# Patient Record
Sex: Female | Born: 1960
Health system: Southern US, Community
[De-identification: ages and names within clinical notes are randomized; demographics above are authoritative.]

## PROBLEM LIST (undated history)

## (undated) DIAGNOSIS — Z800 Family history of malignant neoplasm of digestive organs: Secondary | ICD-10-CM

## (undated) DIAGNOSIS — Z803 Family history of malignant neoplasm of breast: Secondary | ICD-10-CM

## (undated) DIAGNOSIS — M67972 Unspecified disorder of synovium and tendon, left ankle and foot: Secondary | ICD-10-CM

## (undated) DIAGNOSIS — Z1379 Encounter for other screening for genetic and chromosomal anomalies: Secondary | ICD-10-CM

## (undated) DIAGNOSIS — D649 Anemia, unspecified: Secondary | ICD-10-CM

## (undated) DIAGNOSIS — R519 Headache, unspecified: Secondary | ICD-10-CM

## (undated) DIAGNOSIS — R51 Headache: Secondary | ICD-10-CM

## (undated) DIAGNOSIS — F419 Anxiety disorder, unspecified: Secondary | ICD-10-CM

## (undated) HISTORY — DX: Family history of malignant neoplasm of digestive organs: Z80.0

## (undated) HISTORY — PX: EYE SURGERY: SHX253

## (undated) HISTORY — PX: UTERINE FIBROID SURGERY: SHX826

## (undated) HISTORY — PX: TONSILLECTOMY: SUR1361

## (undated) HISTORY — PX: BREAST CYST EXCISION: SHX579

## (undated) HISTORY — DX: Unspecified disorder of synovium and tendon, left ankle and foot: M67.972

## (undated) HISTORY — DX: Encounter for other screening for genetic and chromosomal anomalies: Z13.79

## (undated) HISTORY — DX: Family history of malignant neoplasm of breast: Z80.3

---
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
## 1995-10-20 HISTORY — PX: GANGLION CYST EXCISION: SHX1691

## 2007-10-20 DIAGNOSIS — I499 Cardiac arrhythmia, unspecified: Secondary | ICD-10-CM

## 2007-10-20 HISTORY — DX: Cardiac arrhythmia, unspecified: I49.9

## 2013-02-17 ENCOUNTER — Other Ambulatory Visit: Payer: Self-pay | Admitting: Family Medicine

## 2013-02-17 ENCOUNTER — Ambulatory Visit
Admission: RE | Admit: 2013-02-17 | Discharge: 2013-02-17 | Disposition: A | Discharge status: Home / Self Care | Payer: 59 | Source: Ambulatory Visit | Attending: Family Medicine | Admitting: Family Medicine

## 2013-02-17 DIAGNOSIS — J984 Other disorders of lung: Secondary | ICD-10-CM

## 2013-03-09 ENCOUNTER — Other Ambulatory Visit: Payer: Self-pay

## 2013-03-09 DIAGNOSIS — Z1231 Encounter for screening mammogram for malignant neoplasm of breast: Secondary | ICD-10-CM

## 2013-03-17 ENCOUNTER — Ambulatory Visit
Admission: RE | Admit: 2013-03-17 | Discharge: 2013-03-17 | Disposition: A | Discharge status: Home / Self Care | Payer: 59 | Source: Ambulatory Visit

## 2013-03-17 DIAGNOSIS — Z1231 Encounter for screening mammogram for malignant neoplasm of breast: Secondary | ICD-10-CM

## 2013-03-23 ENCOUNTER — Other Ambulatory Visit: Payer: Self-pay | Admitting: Obstetrics and Gynecology

## 2013-03-23 DIAGNOSIS — R928 Other abnormal and inconclusive findings on diagnostic imaging of breast: Secondary | ICD-10-CM

## 2013-04-05 ENCOUNTER — Other Ambulatory Visit: Payer: 59

## 2013-04-07 ENCOUNTER — Ambulatory Visit
Admission: RE | Admit: 2013-04-07 | Discharge: 2013-04-07 | Disposition: A | Discharge status: Home / Self Care | Payer: 59 | Source: Ambulatory Visit | Attending: Obstetrics and Gynecology | Admitting: Obstetrics and Gynecology

## 2013-04-07 DIAGNOSIS — R928 Other abnormal and inconclusive findings on diagnostic imaging of breast: Secondary | ICD-10-CM

## 2013-09-11 ENCOUNTER — Other Ambulatory Visit: Payer: Self-pay | Admitting: Obstetrics and Gynecology

## 2013-09-11 DIAGNOSIS — N6082 Other benign mammary dysplasias of left breast: Secondary | ICD-10-CM

## 2013-10-05 ENCOUNTER — Ambulatory Visit
Admission: RE | Admit: 2013-10-05 | Discharge: 2013-10-05 | Disposition: A | Discharge status: Home / Self Care | Payer: Self-pay | Source: Ambulatory Visit | Attending: Obstetrics and Gynecology | Admitting: Obstetrics and Gynecology

## 2013-10-05 DIAGNOSIS — N6082 Other benign mammary dysplasias of left breast: Secondary | ICD-10-CM

## 2014-03-06 ENCOUNTER — Other Ambulatory Visit: Payer: Self-pay

## 2014-03-06 DIAGNOSIS — Z1231 Encounter for screening mammogram for malignant neoplasm of breast: Secondary | ICD-10-CM

## 2014-04-10 ENCOUNTER — Encounter (INDEPENDENT_AMBULATORY_CARE_PROVIDER_SITE_OTHER): Payer: Self-pay

## 2014-04-10 ENCOUNTER — Ambulatory Visit
Admission: RE | Admit: 2014-04-10 | Discharge: 2014-04-10 | Disposition: A | Discharge status: Home / Self Care | Payer: 59 | Source: Ambulatory Visit

## 2014-04-10 DIAGNOSIS — Z1231 Encounter for screening mammogram for malignant neoplasm of breast: Secondary | ICD-10-CM

## 2014-04-23 IMAGING — MG MM DIGITAL DIAGNOSTIC BILAT
4 series · 4 of 4 positions shown · non-contrast
Comparison: 02/18/2013 and earlier

CLINICAL DATA: The patient returns after screening study for
evaluation of both breasts.

DIGITAL DIAGNOSTIC BILATERAL MAMMOGRAM WITH CAD AND BILATERAL
BREAST ULTRASOUND:

[L MLO]
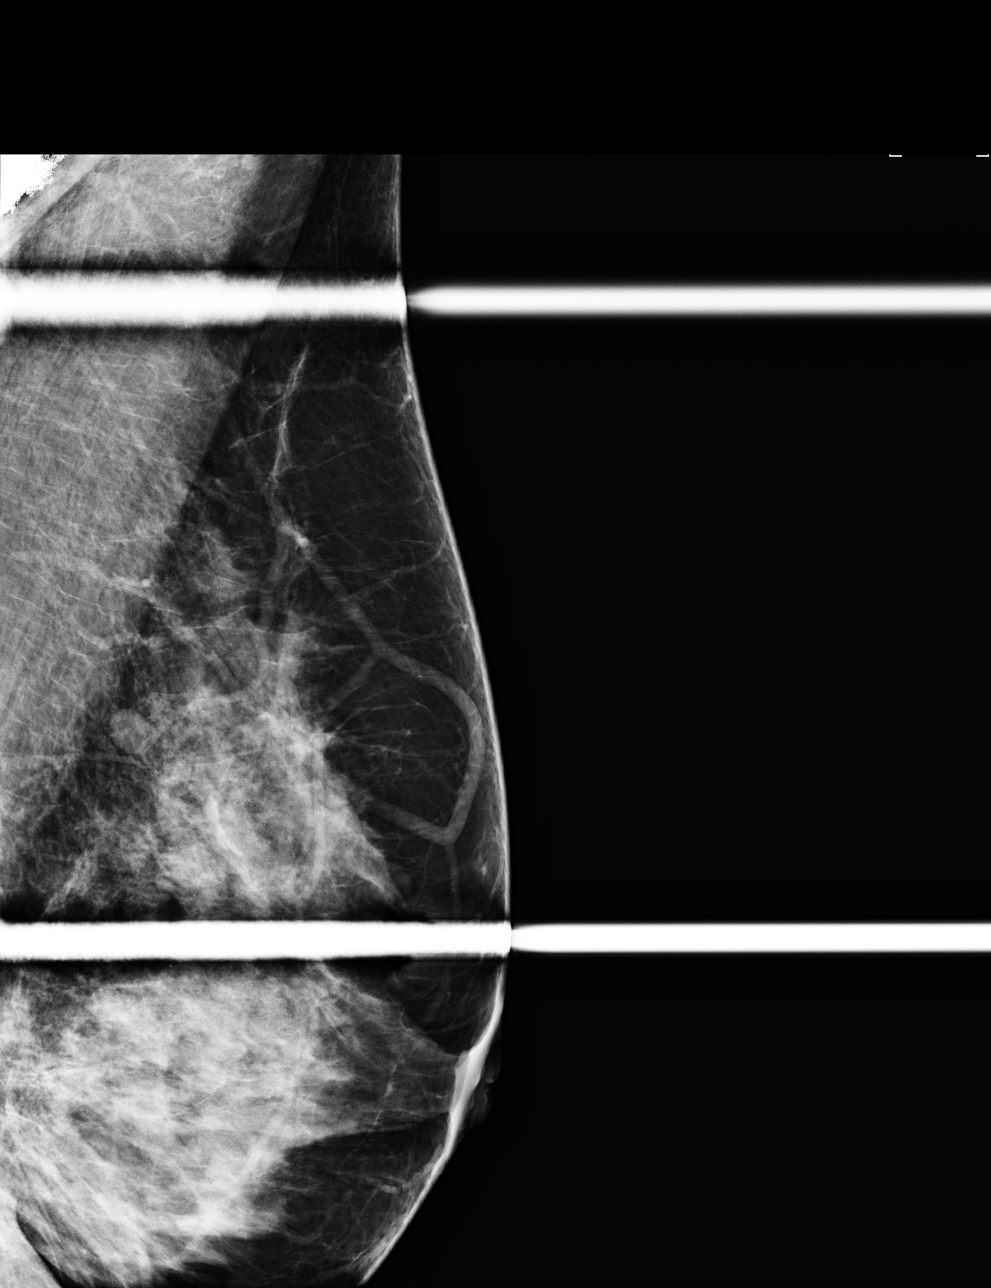

[R ML]
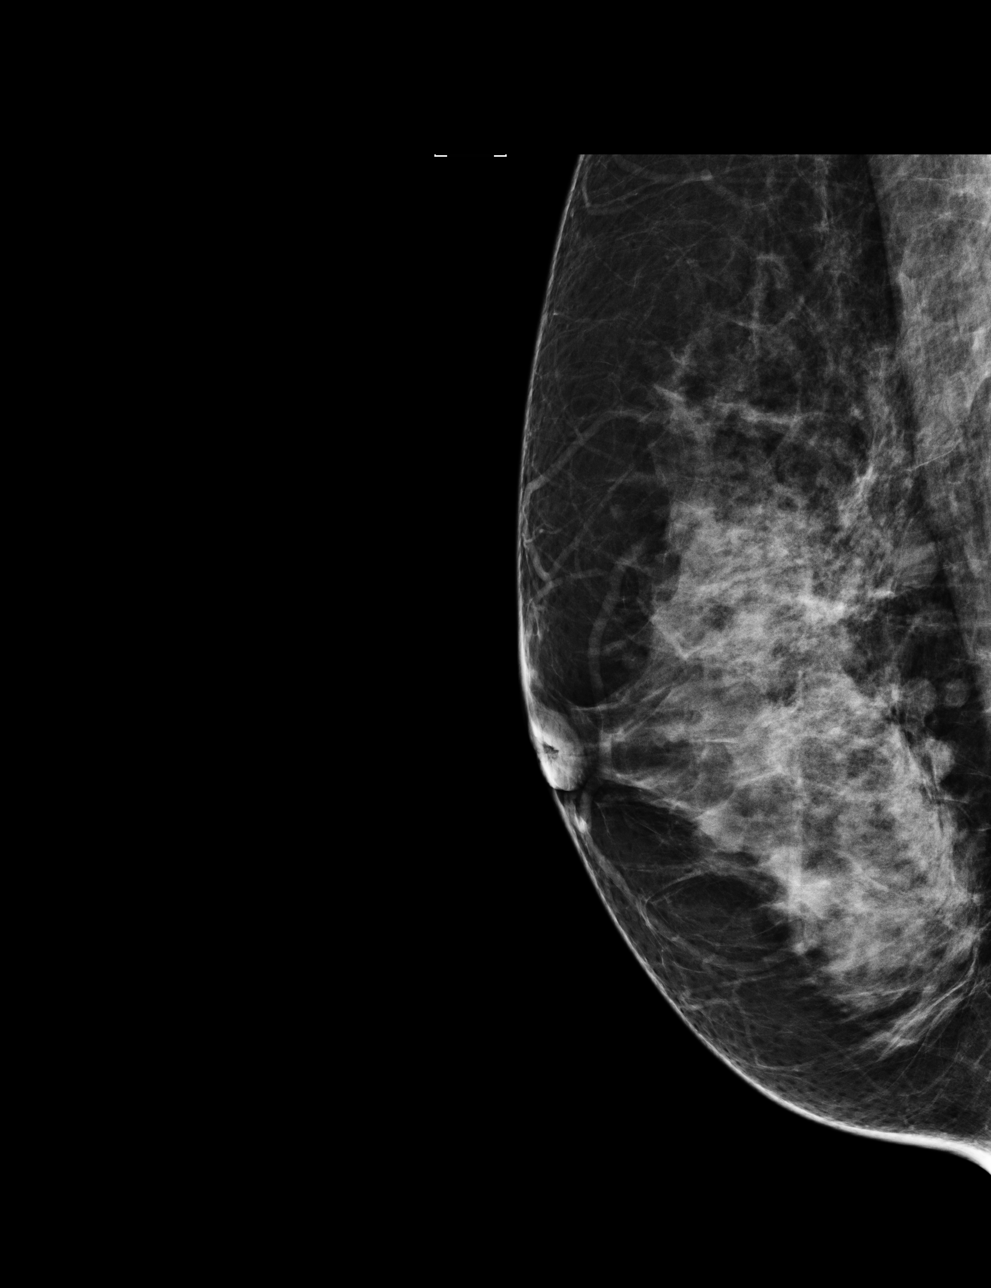

[R MLO]
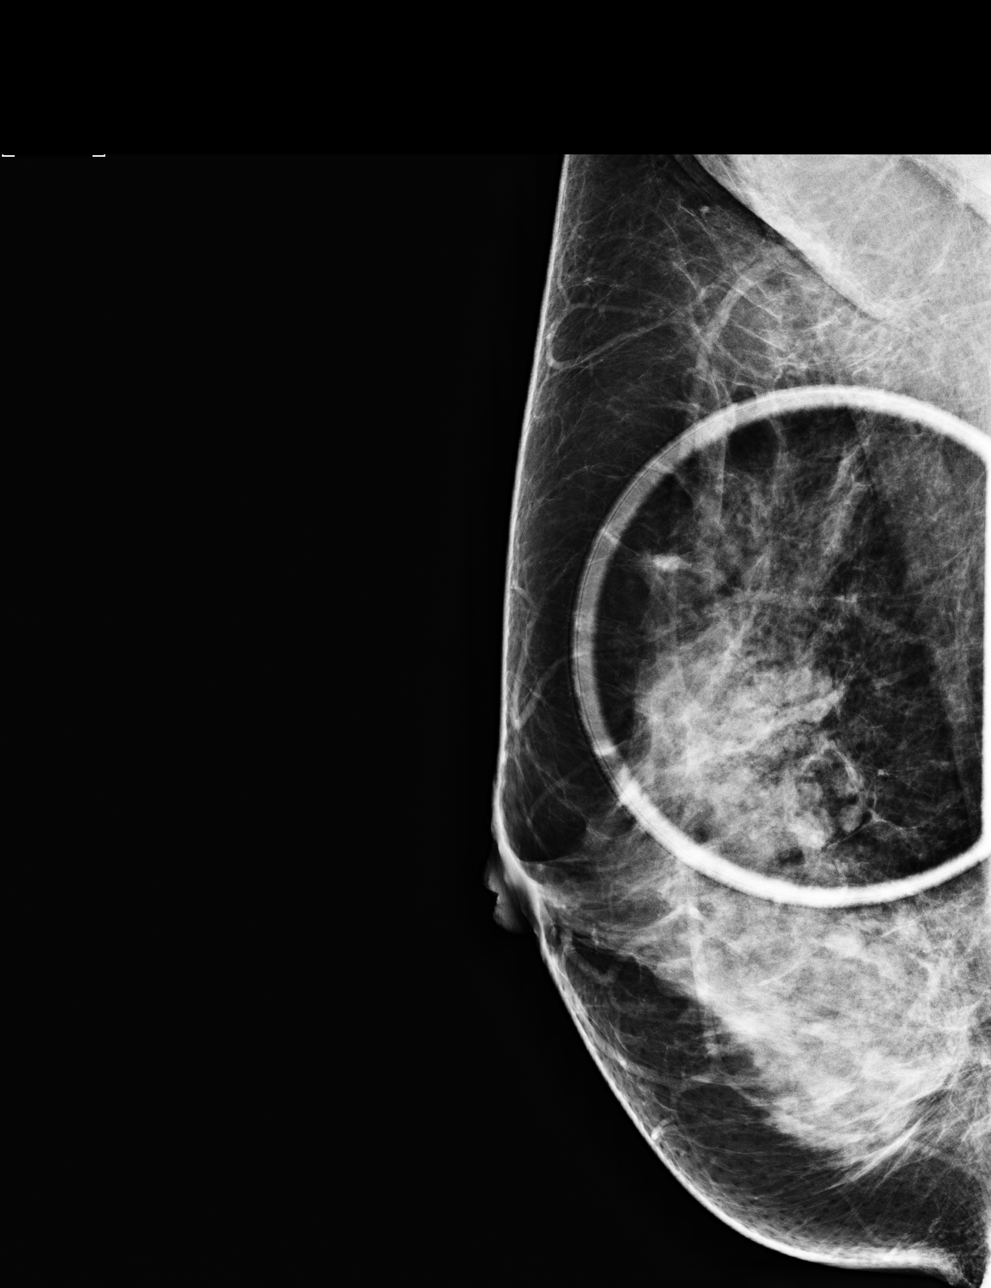

[L XCCL]
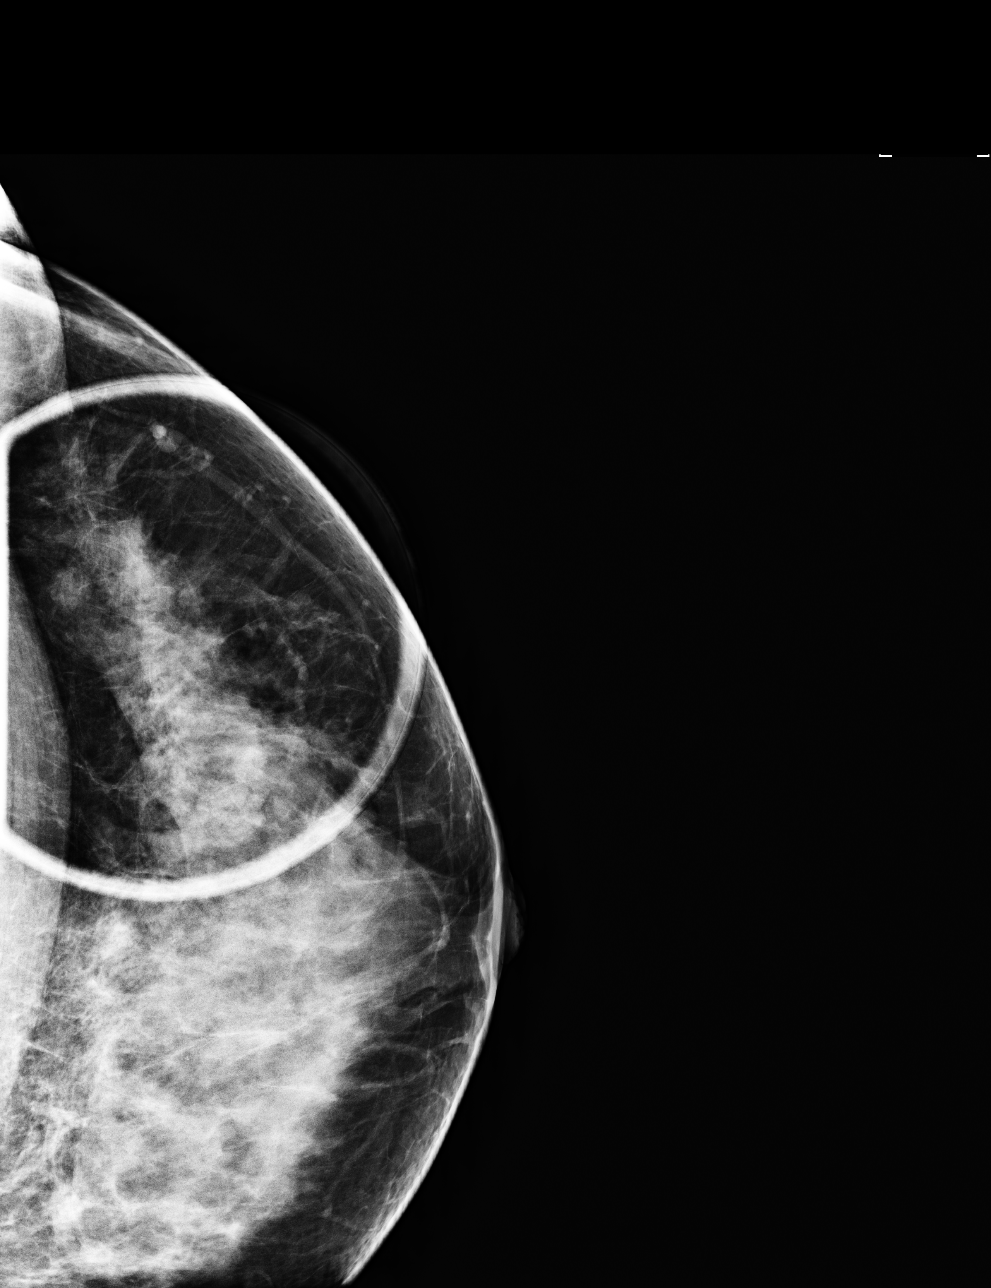

[4 of 4 positions shown; findings below may reference images not displayed]

FINDINGS: ACR Breast Density Category d: The breast tissue is extremely
dense.

Spot compression views are performed of the upper portion of the
right breast, showing no persistent abnormality.  Within the left
breast, there is minimal nodularity in the upper-outer quadrant,
further evaluated with ultrasound.

Mammographic images were processed with CAD.

On physical exam, I palpate no abnormality in the upper portion of
the right breast or in the upper-outer quadrant of the left breast.

Ultrasound is performed, showing a simple cyst in the 9 o'clock
location of the right breast, 7 cm from the nipple, measuring 1 cm
in diameter.  An adjacent small intramammary lymph node is 6 mm in
diameter.  Multiple small cysts are identified in the 8 o'clock
location.

In the lateral portion of the left breast, there are multiple small
simple cysts.  Additionally, there is an area of probable apocrine
metaplasia in the 4 o'clock location 3 cm from nipple.  This
measures 1.1 x 0.6 x 1.2 cm.  No solid mass or area of acoustic
shadowing identified.
IMPRESSION: 1.  Bilateral simple cysts.
2.  Area of probable apocrine metaplasia in the 4 o'clock location
of the left breast for which follow-up is recommended.

RECOMMENDATION:
Follow-up left ultrasound is suggested in 6 months to assess
stability.

I have discussed the findings and recommendations with the patient.
Results were also provided in writing at the conclusion of the
visit.  If applicable, a reminder letter will be sent to the
patient regarding the next appointment.

BI-RADS CATEGORY 3:  Probably benign finding(s) - short interval
follow-up suggested.

## 2015-05-29 ENCOUNTER — Other Ambulatory Visit: Payer: Self-pay

## 2015-05-29 DIAGNOSIS — Z1231 Encounter for screening mammogram for malignant neoplasm of breast: Secondary | ICD-10-CM

## 2015-06-07 ENCOUNTER — Ambulatory Visit: Payer: Self-pay

## 2015-06-12 ENCOUNTER — Ambulatory Visit
Admission: RE | Admit: 2015-06-12 | Discharge: 2015-06-12 | Disposition: A | Discharge status: Home / Self Care | Payer: BLUE CROSS/BLUE SHIELD | Source: Ambulatory Visit

## 2015-06-12 DIAGNOSIS — Z1231 Encounter for screening mammogram for malignant neoplasm of breast: Secondary | ICD-10-CM

## 2016-08-14 ENCOUNTER — Other Ambulatory Visit: Payer: Self-pay | Admitting: Family Medicine

## 2016-08-14 DIAGNOSIS — Z1231 Encounter for screening mammogram for malignant neoplasm of breast: Secondary | ICD-10-CM

## 2016-09-07 ENCOUNTER — Ambulatory Visit
Admission: RE | Admit: 2016-09-07 | Discharge: 2016-09-07 | Disposition: A | Discharge status: Home / Self Care | Payer: BLUE CROSS/BLUE SHIELD | Source: Ambulatory Visit | Attending: Family Medicine | Admitting: Family Medicine

## 2016-09-07 DIAGNOSIS — Z1231 Encounter for screening mammogram for malignant neoplasm of breast: Secondary | ICD-10-CM

## 2016-10-15 ENCOUNTER — Telehealth: Payer: Self-pay

## 2016-10-15 NOTE — Telephone Encounter (Signed)
PATIENT RECEIVED LETTER TO SCHEDULE TCS [phone]

## 2016-10-21 NOTE — Telephone Encounter (Signed)
LMOM to call.

## 2016-10-26 ENCOUNTER — Other Ambulatory Visit: Payer: Self-pay

## 2016-10-26 ENCOUNTER — Telehealth: Payer: Self-pay

## 2016-10-26 DIAGNOSIS — Z800 Family history of malignant neoplasm of digestive organs: Secondary | ICD-10-CM

## 2016-10-26 NOTE — Telephone Encounter (Signed)
Gastroenterology Pre-Procedure Review  Request Date: 10/26/2016 Requesting Physician: Dr. Sherwood GamblerFusco  PATIENT REVIEW QUESTIONS: The patient responded to the following health history questions as indicated:    1. Diabetes Melitis: no 2. Joint replacements in the past 12 months: no 3. Major health problems in the past 3 months: no 4. Has an artificial valve or MVP: no 5. Has a defibrillator: no 6. Has been advised in past to take antibiotics in advance of a procedure like teeth cleaning: no 7. Family history of colon cancer: YES    Her mom and not sure of her age 89. Alcohol Use: Rarely 9. History of sleep apnea: no  10. History of coronary artery or other vascular stents placed within the last 12 months: no    MEDICATIONS & ALLERGIES:    Patient reports the following regarding taking any blood thinners:   Plavix? no Aspirin? no Coumadin? no Brilinta? no Xarelto? no Eliquis? no Pradaxa? no Savaysa? no Effient? no  Patient confirms/reports the following medications:  Current Outpatient Prescriptions  Medication Sig Dispense Refill  . diphenhydrAMINE (BENADRYL) 25 MG tablet Take 25 mg by mouth every 6 (six) hours as needed. Rotates with Tylenol PM at bedtime    . diphenhydramine-acetaminophen (TYLENOL PM) 25-500 MG TABS tablet Take 1 tablet by mouth at bedtime as needed. Rotates with Benadryl at bedtime     No current facility-administered medications for this visit.     Patient confirms/reports the following allergies:  Allergies  Allergen Reactions  . Erythromycin Hives and Nausea And Vomiting    No orders of the defined types were placed in this encounter.   AUTHORIZATION INFORMATION Primary Insurance:  ID #:   Group #:  Pre-Cert / Auth required:  Pre-Cert / Auth #:   Secondary Insurance:   ID #:  Group #:  Pre-Cert / Auth required:  Pre-Cert / Auth #:   SCHEDULE INFORMATION: Procedure has been scheduled as follows:  Date: 12/07/2016              Time: 8:30 AM   Location: [facility] Stay  This Gastroenterology Pre-Precedure Review Form is being routed to the following provider(s): Jonette EvaSandi Fields, MD

## 2016-10-27 MED ORDER — NA SULFATE-K SULFATE-MG SULF 17.5-3.13-1.6 GM/177ML PO SOLN: 1.0000 | ORAL | 0 refills | Status: DC

## 2016-10-27 NOTE — Telephone Encounter (Signed)
See separate triage.  

## 2016-10-27 NOTE — Telephone Encounter (Signed)

## 2016-10-27 NOTE — Telephone Encounter (Signed)
Rx sent to the pharmacy and instructions mailed to pt.  

## 2016-11-19 MED ORDER — PEG 3350-KCL-NA BICARB-NACL 420 G PO SOLR: 4000.0000 mL | ORAL | 0 refills | Status: DC

## 2016-11-19 NOTE — Addendum Note (Signed)
Addended by: Lavena BullionSTEWART, Dezerae Freiberger H on: 11/19/2016 08:56 AM   Modules accepted: Orders

## 2016-11-19 NOTE — Telephone Encounter (Signed)
Suprep not covered, sending in Loyalr[employer] and mailing new instructions and LMOM for pt to disregard the previous instructions.

## 2016-12-03 ENCOUNTER — Telehealth: Payer: Self-pay

## 2016-12-03 NOTE — Telephone Encounter (Signed)
I called [employer] @ [phone] and spoke to Montenegroherita S who said a PA is not required for the colonoscopy as out patient.

## 2016-12-03 NOTE — Telephone Encounter (Signed)
REVIEWED-NO ADDITIONAL RECOMMENDATIONS. 

## 2016-12-03 NOTE — Telephone Encounter (Signed)
I called pt to update triage and she has had no change in her meds since she was triaged.

## 2016-12-07 ENCOUNTER — Encounter (HOSPITAL_COMMUNITY)
Admission: RE | Disposition: A | Discharge status: Home Health | Payer: Self-pay | Source: Ambulatory Visit | Attending: Gastroenterology

## 2016-12-07 ENCOUNTER — Ambulatory Visit (HOSPITAL_COMMUNITY)
Admission: RE | Admit: 2016-12-07 | Discharge: 2016-12-07 | Disposition: A | Discharge status: Home Health | Payer: BLUE CROSS/BLUE SHIELD | Source: Ambulatory Visit | Attending: Gastroenterology | Admitting: Gastroenterology

## 2016-12-07 ENCOUNTER — Encounter (HOSPITAL_COMMUNITY): Payer: Self-pay

## 2016-12-07 DIAGNOSIS — Z800 Family history of malignant neoplasm of digestive organs: Secondary | ICD-10-CM

## 2016-12-07 DIAGNOSIS — Z1212 Encounter for screening for malignant neoplasm of rectum: Secondary | ICD-10-CM

## 2016-12-07 DIAGNOSIS — K644 Residual hemorrhoidal skin tags: Secondary | ICD-10-CM | POA: Insufficient documentation

## 2016-12-07 DIAGNOSIS — Z79899 Other long term (current) drug therapy: Secondary | ICD-10-CM | POA: Insufficient documentation

## 2016-12-07 DIAGNOSIS — F419 Anxiety disorder, unspecified: Secondary | ICD-10-CM | POA: Diagnosis not present

## 2016-12-07 DIAGNOSIS — K648 Other hemorrhoids: Secondary | ICD-10-CM | POA: Diagnosis not present

## 2016-12-07 DIAGNOSIS — K5730 Diverticulosis of large intestine without perforation or abscess without bleeding: Secondary | ICD-10-CM | POA: Diagnosis not present

## 2016-12-07 DIAGNOSIS — Z1211 Encounter for screening for malignant neoplasm of colon: Secondary | ICD-10-CM

## 2016-12-07 HISTORY — DX: Headache, unspecified: R51.9

## 2016-12-07 HISTORY — DX: Headache: R51

## 2016-12-07 HISTORY — DX: Anxiety disorder, unspecified: F41.9

## 2016-12-07 HISTORY — PX: COLONOSCOPY: SHX5424

## 2016-12-07 SURGERY — COLONOSCOPY
Anesthesia: Moderate Sedation

## 2016-12-07 MED ORDER — MEPERIDINE HCL 100 MG/ML IJ SOLN
INTRAMUSCULAR | Status: AC
  Filled 2016-12-07: qty 2

## 2016-12-07 MED ORDER — MEPERIDINE HCL 100 MG/ML IJ SOLN
INTRAMUSCULAR | Status: DC | PRN
  Administered 2016-12-07 (×2): 25 mg via INTRAVENOUS

## 2016-12-07 MED ORDER — MIDAZOLAM HCL 5 MG/5ML IJ SOLN
INTRAMUSCULAR | Status: DC | PRN
  Administered 2016-12-07 (×2): 2 mg via INTRAVENOUS

## 2016-12-07 MED ORDER — STERILE WATER FOR IRRIGATION IR SOLN
Status: DC | PRN
  Administered 2016-12-07: 09:00:00

## 2016-12-07 MED ORDER — SODIUM CHLORIDE 0.9 % IV SOLN
INTRAVENOUS | Status: DC
  Administered 2016-12-07: 09:00:00 via INTRAVENOUS

## 2016-12-07 MED ORDER — MIDAZOLAM HCL 5 MG/5ML IJ SOLN
INTRAMUSCULAR | Status: AC
  Filled 2016-12-07: qty 10

## 2016-12-07 NOTE — Discharge Instructions (Signed)
You DID NOT HAVE ANY POLYPS. YOU HAVE DIVERTICULOSIS IN YOUR LEFT COLON. You have MODERATE internal AND EXTERNAL hemorrhoids.   DRINK WATER TO KEEP URINE LIGHT YELLOW.  FOLLOW A HIGH FIBER DIET. AVOID ITEMS THAT CAUSE BLOATING & GAS. SEE INFO BELOW.  Next colonoscopy in 10 years BECAUSE YOUR MOTHER HAD COLON CANCER AFTER THE AGE OF 60 AND YOU HAVE HAD TWO COLONOSCOPIES AND HAVE NOT HAD POLYPS.   Colonoscopy Care After Read the instructions outlined below and refer to this sheet in the next week. These discharge instructions provide you with general information on caring for yourself after you leave the hospital. While your treatment has been planned according to the most current medical practices available, unavoidable complications occasionally occur. If you have any problems or questions after discharge, call DR. Wanda Rideout, [phone].  ACTIVITY  You may resume your regular activity, but move at a slower pace for the next 24 hours.   Take frequent rest periods for the next 24 hours.   Walking will help get rid of the air and reduce the bloated feeling in your belly (abdomen).   No driving for 24 hours (because of the medicine (anesthesia) used during the test).   You may shower.   Do not sign any important legal documents or operate any machinery for 24 hours (because of the anesthesia used during the test).    NUTRITION  Drink plenty of fluids.   You may resume your normal diet as instructed by your doctor.   Begin with a light meal and progress to your normal diet. Heavy or fried foods are harder to digest and may make you feel sick to your stomach (nauseated).   Avoid alcoholic beverages for 24 hours or as instructed.    MEDICATIONS  You may resume your normal medications.   WHAT YOU CAN EXPECT TODAY  Some feelings of bloating in the abdomen.   Passage of more gas than usual.   Spotting of blood in your stool or on the toilet paper  .  IF YOU HAD POLYPS  REMOVED DURING THE COLONOSCOPY:  Eat a soft diet IF YOU HAVE NAUSEA, BLOATING, ABDOMINAL PAIN, OR VOMITING.    FINDING OUT THE RESULTS OF YOUR TEST Not all test results are available during your visit. DR. Darrick Penna WILL CALL YOU WITHIN 14 DAYS OF YOUR PROCEDUE WITH YOUR RESULTS. Do not assume everything is normal if you have not heard from DR. Princeston Blizzard, CALL HER OFFICE AT [phone].  SEEK IMMEDIATE MEDICAL ATTENTION AND CALL THE OFFICE: [phone] IF:  You have more than a spotting of blood in your stool.   Your belly is swollen (abdominal distention).   You are nauseated or vomiting.   You have a temperature over 101F.   You have abdominal pain or discomfort that is severe or gets worse throughout the day.   Constipation in Adults Constipation is having fewer than 2 bowel movements per week. Usually, the stools are hard. As we grow older, constipation is more common. If you try to fix constipation with laxatives, the problem may get worse. This is because laxatives taken over a long period of time make the colon muscles weaker. A low-fiber diet, not taking in enough fluids, and taking some medicines may make these problems worse.  HOME CARE INSTRUCTIONS  Constipation is usually best cared for without medicines. Increasing dietary fiber and eating more fruits and vegetables is the best way to manage constipation.   Slowly increase fiber intake to 25 to 38  grams per day. Whole grains, fruits, vegetables, and legumes are good sources of fiber. A dietitian can further help you incorporate high-fiber foods into your diet.   Drink enough water and fluids to keep your urine clear or pale yellow.   A fiber supplement may be added to your diet if you cannot get enough fiber from foods.   Increasing your activities also helps improve regularity.   Stronger measures, such as magnesium sulfate, should be avoided if possible. This may cause uncontrollable diarrhea. Using magnesium sulfate  may not allow you time to make it to the bathroom.    High-Fiber Diet A high-fiber diet changes your normal diet to include more whole grains, legumes, fruits, and vegetables. Changes in the diet involve replacing refined carbohydrates with unrefined foods. The calorie level of the diet is essentially unchanged. The Dietary Reference Intake (recommended amount) for adult males is 38 grams per day. For adult females, it is 25 grams per day. Pregnant and lactating women should consume 28 grams of fiber per day. Fiber is the intact part of a plant that is not broken down during digestion. Functional fiber is fiber that has been isolated from the plant to provide a beneficial effect in the body. PURPOSE  Increase stool bulk.   Ease and regulate bowel movements.   Lower cholesterol.   REDUCE RISK OF COLON CANCER  INDICATIONS THAT YOU NEED MORE FIBER  Constipation and hemorrhoids.   Uncomplicated diverticulosis (intestine condition) and irritable bowel syndrome.   Weight management.   As a protective measure against hardening of the arteries (atherosclerosis), diabetes, and cancer.   GUIDELINES FOR INCREASING FIBER IN THE DIET  Start adding fiber to the diet slowly. A gradual increase of about 5 more grams (2 slices of whole-wheat bread, 2 servings of most fruits or vegetables, or 1 bowl of high-fiber cereal) per day is best. Too rapid an increase in fiber may result in constipation, flatulence, and bloating.   Drink enough water and fluids to keep your urine clear or pale yellow. Water, juice, or caffeine-free drinks are recommended. Not drinking enough fluid may cause constipation.   Eat a variety of high-fiber foods rather than one type of fiber.   Try to increase your intake of fiber through using high-fiber foods rather than fiber pills or supplements that contain small amounts of fiber.   The goal is to change the types of food eaten. Do not supplement your present diet with  high-fiber foods, but replace foods in your present diet.   INCLUDE A VARIETY OF FIBER SOURCES  Replace refined and processed grains with whole grains, canned fruits with fresh fruits, and incorporate other fiber sources. White rice, white breads, and most bakery goods contain little or no fiber.   Brown whole-grain rice, buckwheat oats, and many fruits and vegetables are all good sources of fiber. These include: broccoli, Brussels sprouts, cabbage, cauliflower, beets, sweet potatoes, white potatoes (skin on), carrots, tomatoes, eggplant, squash, berries, fresh fruits, and dried fruits.   Cereals appear to be the richest source of fiber. Cereal fiber is found in whole grains and bran. Bran is the fiber-rich outer coat of cereal grain, which is largely removed in refining. In whole-grain cereals, the bran remains. In breakfast cereals, the largest amount of fiber is found in those with "bran" in their names. The fiber content is sometimes indicated on the label.   You may need to include additional fruits and vegetables each day.   In baking, for 1  cup white flour, you may use the following substitutions:   1 cup whole-wheat flour minus 2 tablespoons.   1/2 cup white flour plus 1/2 cup whole-wheat flour.   Diverticulosis Diverticulosis is a common condition that develops when small pouches (diverticula) form in the wall of the colon. The risk of diverticulosis increases with age. It happens more often in people who eat a low-fiber diet. Most individuals with diverticulosis have no symptoms. Those individuals with symptoms usually experience belly (abdominal) pain, constipation, or loose stools (diarrhea).  HOME CARE INSTRUCTIONS  Increase the amount of fiber in your diet as directed by your caregiver or dietician. This may reduce symptoms of diverticulosis.   Drink at least 6 to 8 glasses of water each day to prevent constipation.   Try not to strain when you have a bowel movement.    THERE IS NO NEED TO Avoid nuts and seeds to prevent complications.   FOODS HAVING HIGH FIBER CONTENT INCLUDE:  Fruits. Apple, peach, pear, tangerine, raisins, prunes.   Vegetables. Brussels sprouts, asparagus, broccoli, cabbage, carrot, cauliflower, romaine lettuce, spinach, summer squash, tomato, winter squash, zucchini.   Starchy Vegetables. Baked beans, kidney beans, lima beans, split peas, lentils, potatoes (with skin).   Grains. Whole wheat bread, brown rice, bran flake cereal, plain oatmeal, white rice, shredded wheat, bran muffins.    Hemorrhoids Hemorrhoids are dilated (enlarged) veins around the rectum. Sometimes clots will form in the veins. This makes them swollen and painful. These are called thrombosed hemorrhoids. Causes of hemorrhoids include:  Constipation.   Straining to have a bowel movement.   HEAVY LIFTING  HOME CARE INSTRUCTIONS  Eat a well balanced diet and drink 6 to 8 glasses of water every day to avoid constipation. You may also use a bulk laxative.   Avoid straining to have bowel movements.   Keep anal area dry and clean.   Do not use a donut shaped pillow or sit on the toilet for long periods. This increases blood pooling and pain.   Move your bowels when your body has the urge; this will require less straining and will decrease pain and pressure.

## 2016-12-07 NOTE — Op Note (Signed)
[facility] Patient Name: Carolyn Glover Procedure Date: 12/07/2016 7:13 AM MRN: [id number] Date of Birth: 1961-01-18 Attending MD: Jonette Eva , MD CSN: [id number] Age: 56 Admit Type: Outpatient Procedure:                Colonoscopy, SCREENING Indications:              Screening in patient at increased risk: Colorectal                            cancer in mother 59 or older. HAD COLONOSCOPY                            PREVIOUSLY-NO POLYPS Providers:                Jonette Eva, MD, Loma Messing B. Patsy Lager, RN, Dyann Ruddle Referring MD:             Avis Epley PA Medicines:                Meperidine 50 mg IV, Midazolam 4 mg IV Complications:            No immediate complications. Estimated Blood Loss:     Estimated blood loss: none. Procedure:                Pre-Anesthesia Assessment:                           - Prior to the procedure, a History and Physical                            was performed, and patient medications and                            allergies were reviewed. The patient's tolerance of                            previous anesthesia was also reviewed. The risks                            and benefits of the procedure and the sedation                            options and risks were discussed with the patient.                            All questions were answered, and informed consent                            was obtained. Prior Anticoagulants: The patient has                            taken no previous anticoagulant or antiplatelet  agents. ASA Grade Assessment: II - A patient with                            mild systemic disease. After reviewing the risks                            and benefits, the patient was deemed in                            satisfactory condition to undergo the procedure.                            After obtaining informed consent, the colonoscope   was passed under direct vision. Throughout the                            procedure, the patient's blood pressure, pulse, and                            oxygen saturations were monitored continuously. The                            EC-[id number] ([id number]) scope was introduced through                            the anus and advanced to the 3 cm into the ileum.                            The colonoscopy was somewhat difficult due to a                            tortuous colon. Successful completion of the                            procedure was aided by COLOWRAP. The patient                            tolerated the procedure well. The quality of the                            bowel preparation was excellent. The terminal                            ileum, ileocecal valve, appendiceal orifice, and                            rectum were photographed. Scope In: 8:56:11 AM Scope Out: 9:09:50 AM Total Procedure Duration: 0 hours 13 minutes 39 seconds  Findings:      The digital rectal exam findings include non-thrombosed external       hemorrhoids.      The terminal ileum appeared normal.      Multiple small and large-mouthed diverticula were found in the       recto-sigmoid colon and sigmoid colon.  External and internal hemorrhoids were found during retroflexion. The       hemorrhoids were moderate. Impression:               - Non-thrombosed external hemorrhoids found on                            digital rectal exam.                           - The examined portion of the ileum was normal.                           - Diverticulosis in the recto-sigmoid colon and in                            the sigmoid colon.                           - External and internal hemorrhoids. Moderate Sedation:      Moderate (conscious) sedation was administered by the endoscopy nurse       and supervised by the endoscopist. The following parameters were       monitored: oxygen saturation, heart rate, blood  pressure, and response       to care. Total physician intraservice time was 22 minutes. Recommendation:           - High fiber diet.                           - Continue present medications.                           - Repeat colonoscopy in 10 years for surveillance                            BECAUSE MOTHER HAD COLON CANECR AFTER AGE 72.                           - Patient has a contact number available for                            emergencies. The signs and symptoms of potential                            delayed complications were discussed with the                            patient. Return to normal activities tomorrow.                            Written discharge instructions were provided to the                            patient. Procedure Code(s):        --- Professional ---  [phone], Colonoscopy, flexible; diagnostic, including                            collection of specimen(s) by brushing or washing,                            when performed (separate procedure)                           99152, Moderate sedation services provided by the                            same physician or other qualified health care                            professional performing the diagnostic or                            therapeutic service that the sedation supports,                            requiring the presence of an independent trained                            observer to assist in the monitoring of the                            patient's level of consciousness and physiological                            status; initial 15 minutes of intraservice time,                            patient age 69 years or older Diagnosis Code(s):        --- Professional ---                           Z80.0, Family history of malignant neoplasm of                            digestive organs                           K64.4, Residual hemorrhoidal skin tags                            K64.8, Other hemorrhoids                           K57.30, Diverticulosis of large intestine without                            perforation or abscess without bleeding CPT copyright 2016 [employer]. All rights reserved. The codes documented in this report are preliminary and upon coder review may  be revised to meet current compliance requirements.  Jonette Eva, MD Jonette Eva, MD 12/07/2016 9:27:38 AM This report has been signed electronically. Number of Addenda: 0

## 2016-12-07 NOTE — Progress Notes (Signed)
Patient's husband informed of discharge instructions. Verbally stated that he understands. No questions at this time.

## 2016-12-07 NOTE — H&P (Addendum)
Primary Care Physician:  Jana Half Primary Gastroenterologist:  Dr. Oneida Alar  Pre-Procedure History & Physical: HPI:  Carolyn Glover is a 56 y.o. female here for COLON CANCER SCREENING-FAMILY Hx COLON CA-Mother HAD COLON CA AGE > 60.  Past Medical History:  Diagnosis Date  . Anxiety   . Headache    Past Surgical History:  Procedure Laterality Date  . BUNIONECTOMY Bilateral 1980  . EYE SURGERY  2004, 2010   lasix,   . GANGLION CYST EXCISION Left 1997  . TONSILLECTOMY     56 years old  . UTERINE FIBROID SURGERY  mid 1990    Prior to Admission medications   Medication Sig Start Date End Date Taking? Authorizing Provider  ALPRAZolam Duanne Moron) 0.5 MG tablet Take 0.5 mg by mouth at bedtime as needed for sleep.  09/01/16  Yes Historical Provider, MD  cycloSPORINE (RESTASIS) 0.05 % ophthalmic emulsion Place 1 drop into both eyes 2 (two) times daily as needed (dry eyes).   Yes Historical Provider, MD  diphenhydrAMINE (BENADRYL) 25 MG tablet Take 25 mg by mouth every 6 (six) hours as needed. Rotates with Tylenol PM at bedtime   Yes Historical Provider, MD  diphenhydramine-acetaminophen (TYLENOL PM) 25-500 MG TABS tablet Take 1 tablet by mouth at bedtime as needed. Rotates with Benadryl at bedtime   Yes Historical Provider, MD  ibuprofen (ADVIL,MOTRIN) 200 MG tablet Take 600 mg by mouth every 6 (six) hours as needed for headache.   Yes Historical Provider, MD  Multiple Vitamin (MULTIVITAMIN WITH MINERALS) TABS tablet Take 1 tablet by mouth daily.   Yes Historical Provider, MD  Polyethyl Glycol-Propyl Glycol (SYSTANE OP) Apply 1 drop to eye daily as needed (dry eyes).   Yes Historical Provider, MD  polyethylene glycol-electrolytes (TRILYTE) 420 g solution Take 4,000 mLs by mouth as directed. 11/19/16  Yes Danie Binder, MD  Na Sulfate-K Sulfate-Mg Sulf (SUPREP BOWEL PREP KIT) 17.5-3.13-1.6 GM/180ML SOLN Take 1 kit by mouth as directed. Patient not taking: Reported on 12/01/2016 10/27/16    Danie Binder, MD    Allergies as of 10/26/2016 - never reviewed  Allergen Reaction Noted  . Erythromycin Hives and Nausea And Vomiting 10/26/2016    Family History  Problem Relation Age of Onset  . Colon cancer Mother     Social History   Social History  . Marital status: Divorced    Spouse name: N/A  . Number of children: N/A  . Years of education: N/A   Occupational History  . Not on file.   Social History Main Topics  . Smoking status: Never Smoker  . Smokeless tobacco: Never Used  . Alcohol use Yes  . Drug use: No  . Sexual activity: Not on file   Other Topics Concern  . Not on file   Social History Narrative  . No narrative on file    Review of Systems: See HPI, otherwise negative ROS   Physical Exam: BP 117/77   Pulse (!) 54   Temp 97.8 F (36.6 C) (Oral)   Resp 10   Ht _0  (1.702 m)   Wt 165 lb (74.8 kg)   SpO2 100%   BMI 25.84 kg/m  General:   Alert,  pleasant and cooperative in NAD Head:  Normocephalic and atraumatic. Neck:  Supple; Lungs:  Clear throughout to auscultation.    Heart:  Regular rate and rhythm. Abdomen:  Soft, nontender and nondistended. Normal bowel sounds, without guarding, and without rebound.   Neurologic:  Alert and  oriented x4;  grossly normal neurologically.  Impression/Plan:     SCREENING  Plan:  1. TCS TODAY. DISCUSSED PROCEDURE, BENEFITS, & RISKS: < 1% chance of medication reaction, bleeding, perforation, or rupture of spleen/liver.

## 2016-12-09 ENCOUNTER — Encounter (HOSPITAL_COMMUNITY): Payer: Self-pay | Admitting: Gastroenterology

## 2017-08-03 ENCOUNTER — Other Ambulatory Visit: Payer: Self-pay | Admitting: Family Medicine

## 2017-08-03 DIAGNOSIS — Z1231 Encounter for screening mammogram for malignant neoplasm of breast: Secondary | ICD-10-CM

## 2017-09-08 ENCOUNTER — Ambulatory Visit
Admission: RE | Admit: 2017-09-08 | Discharge: 2017-09-08 | Disposition: A | Discharge status: Home / Self Care | Payer: BLUE CROSS/BLUE SHIELD | Source: Ambulatory Visit | Attending: Family Medicine | Admitting: Family Medicine

## 2017-09-08 DIAGNOSIS — Z1231 Encounter for screening mammogram for malignant neoplasm of breast: Secondary | ICD-10-CM

## 2018-08-22 ENCOUNTER — Ambulatory Visit: Payer: Managed Care, Other (non HMO) | Admitting: Sports Medicine

## 2018-08-22 ENCOUNTER — Encounter: Payer: Self-pay | Admitting: Sports Medicine

## 2018-08-22 VITALS — BP 108/88 | Ht 66.5 in | Wt 165.0 lb

## 2018-08-22 DIAGNOSIS — R29898 Other symptoms and signs involving the musculoskeletal system: Secondary | ICD-10-CM | POA: Diagnosis not present

## 2018-08-22 DIAGNOSIS — M2140 Flat foot [pes planus] (acquired), unspecified foot: Secondary | ICD-10-CM | POA: Insufficient documentation

## 2018-08-22 DIAGNOSIS — M67972 Unspecified disorder of synovium and tendon, left ankle and foot: Secondary | ICD-10-CM | POA: Diagnosis not present

## 2018-08-22 DIAGNOSIS — M2142 Flat foot [pes planus] (acquired), left foot: Secondary | ICD-10-CM

## 2018-08-22 DIAGNOSIS — M2141 Flat foot [pes planus] (acquired), right foot: Secondary | ICD-10-CM

## 2018-08-22 NOTE — Assessment & Plan Note (Addendum)
Patient has significant pes planus, R greater than L, with h/o bilateral bunionectomies. On gait analysis she does have some pronation issues, again greater on R. Given green orthotics with scaphoid pad.

## 2018-08-22 NOTE — Addendum Note (Signed)
Addended by: Rutha Bouchard E on: 08/22/2018 11:31 AM   Modules accepted: Orders

## 2018-08-22 NOTE — Assessment & Plan Note (Signed)
Patient with some L achilles tightness without acute injury. Given home exercises.

## 2018-08-22 NOTE — Assessment & Plan Note (Signed)
Patient with weak L hip abductors causing some L knee pain along IT band. No acute injury. Given home exercises for strengthening and injury prevention.
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
## 2018-09-19 ENCOUNTER — Other Ambulatory Visit: Payer: Self-pay | Admitting: Family Medicine

## 2018-09-19 DIAGNOSIS — Z1231 Encounter for screening mammogram for malignant neoplasm of breast: Secondary | ICD-10-CM

## 2018-10-03 ENCOUNTER — Ambulatory Visit: Payer: Managed Care, Other (non HMO) | Admitting: Sports Medicine

## 2018-10-03 ENCOUNTER — Encounter: Payer: Self-pay | Admitting: Sports Medicine

## 2018-10-03 VITALS — BP 116/84 | Ht 67.0 in | Wt 172.0 lb

## 2018-10-03 DIAGNOSIS — R29898 Other symptoms and signs involving the musculoskeletal system: Secondary | ICD-10-CM | POA: Diagnosis not present

## 2018-10-03 DIAGNOSIS — M2141 Flat foot [pes planus] (acquired), right foot: Secondary | ICD-10-CM

## 2018-10-03 DIAGNOSIS — M2142 Flat foot [pes planus] (acquired), left foot: Secondary | ICD-10-CM

## 2018-10-03 NOTE — Progress Notes (Signed)
HPI Patient with history of bilateral pes planus and hip abductor weakness presents for follow-up.  She said that she had been doing well with her orthotics despite minimal discomfort until approximately 1 month ago when she woke up with severe pain in her back.  She does not know any significant events that would have caused this and had no rapid changes in exercises.  She went to a chiropractor who told her that she should stop running temporarily and has been focusing on stretches and static exercises since.  That has improved and over the last week she has been on 2 rounds of approximately 2 miles during which she says she is had minimal back pain and no pain in her Achilles tendon, hip, knees.  She did go cycling this morning and had some minimal lateral discomfort in her knees for the first 2 minutes but after she warmed up she said that she was doing fine.  She says that she feels that the orthotics are helpful but are uncomfortable, she describes no blisters or wounding on her feet but she thinks that the orthotics are causing a bulge in the medial aspect of her shoe.  CC: Pes planus, knee pain follow-up  Medications/Interventions Tried: Orthotics, strengthening exercises  See HPI and/or previous note for associated ROS.  Objective: BP 116/84   Ht 5\' 7"  (1.702 m)   Wt 172 lb (78 kg)   BMI 26.94 kg/m  Gen: NAD, well groomed, a/o x3, normal affect.  CV: Well-perfused. Warm.  Resp: Non-labored.  Neuro: Sensation intact throughout Gait: Nonpathologic posture, unremarkable stride without signs of limp or balance issues. MSK: Gait and stance are symmetric with no visible swelling or asymmetry.  She does have mild external rotation of right foot and her stride.  No pain to palpation of ankle knee or hip.  No obvious fluid accumulation.  Range of motion in flexion extension is symmetrical in both knees.  No range of motion restriction in hips bilaterally.  Hip abductors strong bilaterally.   Good strength with bilateral knee extension.  Strong knee flexion bilaterally with neutral-internally rotated-externally rotated ankle.  Straight leg test negative for radiating pain.  No noted sensation deficits patient is neurovascularly intact distally  Assessment and plan:  Weakness of left hip Improved greatly with abduction exercises, patient did have to stop running temporarily due to back pain but that seems to be improving she is optimistic she will be able to get back to full speed.  Flat foot Patient has been expressing some mild discomfort, but with no blistering, on both feet and it felt like that the orthotics were too big.  We have changed out the scaphoid pads from medium to a small which she says felt like an improvement.  She will wear these for the next few months to see if they are comfortable and when they begin to wear out we will offer custom orthotics if they have been helpful.   No orders of the defined types were placed in this encounter.   No orders of the defined types were placed in this encounter.    Marthenia RollingScott Sidra Oldfield, DO PGY2 Resident 10/03/2018 9:54 AM   Patient seen and evaluated with the resident.  I agree with the above plan of care.  Patient's knee pain and Achilles pain has resolved.  She does find her scaphoid pads to be uncomfortable.  I changed them from a medium to a small and she found this to be more comfortable.  She  may continue to increase activity as tolerated including running.  If she finds the smaller scaphoid pads to be comfortable then we could consider custom orthotics at a later date.  Follow-up as needed.

## 2018-10-03 NOTE — Assessment & Plan Note (Signed)
Patient has been expressing some mild discomfort, but with no blistering, on both feet and it felt like that the orthotics were too big.  We have changed out the scaphoid pads from medium to a small which she says felt like an improvement.  She will wear these for the next few months to see if they are comfortable and when they begin to wear out we will offer custom orthotics if they have been helpful.

## 2018-10-03 NOTE — Assessment & Plan Note (Signed)
Improved greatly with abduction exercises, patient did have to stop running temporarily due to back pain but that seems to be improving she is optimistic she will be able to get back to full speed.

## 2018-10-26 ENCOUNTER — Ambulatory Visit
Admission: RE | Admit: 2018-10-26 | Discharge: 2018-10-26 | Disposition: A | Discharge status: Home / Self Care | Payer: Managed Care, Other (non HMO) | Source: Ambulatory Visit | Attending: Family Medicine | Admitting: Family Medicine

## 2018-10-26 DIAGNOSIS — Z1231 Encounter for screening mammogram for malignant neoplasm of breast: Secondary | ICD-10-CM

## 2018-10-27 ENCOUNTER — Institutional Professional Consult (permissible substitution): Payer: Managed Care, Other (non HMO) | Admitting: Pulmonary Disease

## 2018-11-24 ENCOUNTER — Ambulatory Visit (INDEPENDENT_AMBULATORY_CARE_PROVIDER_SITE_OTHER): Payer: Managed Care, Other (non HMO) | Admitting: Pulmonary Disease

## 2018-11-24 ENCOUNTER — Ambulatory Visit: Payer: Managed Care, Other (non HMO) | Admitting: Pulmonary Disease

## 2018-11-24 ENCOUNTER — Encounter: Payer: Self-pay | Admitting: Pulmonary Disease

## 2018-11-24 VITALS — BP 120/82 | HR 75 | Ht 67.0 in | Wt 185.4 lb

## 2018-11-24 DIAGNOSIS — R05 Cough: Secondary | ICD-10-CM | POA: Diagnosis not present

## 2018-11-24 DIAGNOSIS — R053 Chronic cough: Secondary | ICD-10-CM | POA: Insufficient documentation

## 2018-11-24 LAB — PULMONARY FUNCTION TEST
DL/VA % pred: 108 %
DL/VA: 4.51 ml/min/mmHg/L
DLCO unc % pred: 102 %
DLCO unc: 23.15 ml/min/mmHg
FEF 25-75 Post: 2.63 L/sec
FEF 25-75 Pre: 2.95 L/sec
FEF2575-%Change-Post: -10 %
FEF2575-%Pred-Post: 109 %
FEF2575-%Pred-Pre: 122 %
FEV1-%Change-Post: -6 %
FEV1-%Pred-Post: 111 %
FEV1-%Pred-Pre: 118 %
FEV1-Post: 2.71 L
FEV1-Pre: 2.89 L
FEV1FVC-%Change-Post: -6 %
FEV1FVC-%Pred-Pre: 102 %
FEV6-%Change-Post: 0 %
FEV6-%Pred-Post: 117 %
FEV6-%Pred-Pre: 117 %
FEV6-Post: 3.51 L
FEV6-Pre: 3.5 L
FEV6FVC-%Change-Post: 0 %
FEV6FVC-%Pred-Post: 102 %
FEV6FVC-%Pred-Pre: 102 %
FVC-%Change-Post: 0 %
FVC-%Pred-Post: 113 %
FVC-%Pred-Pre: 114 %
FVC-Post: 3.51 L
FVC-Pre: 3.52 L
Post FEV1/FVC ratio: 77 %
Post FEV6/FVC ratio: 100 %
Pre FEV1/FVC ratio: 82 %
Pre FEV6/FVC Ratio: 100 %
RV % pred: 122 %
RV: 2.55 L
TLC % pred: 108 %
TLC: 5.96 L

## 2018-11-24 NOTE — Patient Instructions (Signed)
Chronic Cough - Pulmonary Function Test to rule out obstructive and restrictive defect    Cough, Adult  Coughing is a reflex that clears your throat and your airways. Coughing helps to heal and protect your lungs. It is normal to cough occasionally, but a cough that happens with other symptoms or lasts a long time may be a sign of a condition that needs treatment. A cough may last only 2-3 weeks (acute), or it may last longer than 8 weeks (chronic). What are the causes? Coughing is commonly caused by:  Breathing in substances that irritate your lungs.  A viral or bacterial respiratory infection.  Allergies.  Asthma.  Postnasal drip.  Smoking.  Acid backing up from the stomach into the esophagus (gastroesophageal reflux).  Certain medicines.  Chronic lung problems, including COPD (or rarely, lung cancer).  Other medical conditions such as heart failure. Follow these instructions at home: Pay attention to any changes in your symptoms. Take these actions to help with your discomfort:  Take medicines only as told by your health care provider. ? If you were prescribed an antibiotic medicine, take it as told by your health care provider. Do not stop taking the antibiotic even if you start to feel better. ? Talk with your health care provider before you take a cough suppressant medicine.  Drink enough fluid to keep your urine clear or pale yellow.  If the air is dry, use a cold steam vaporizer or humidifier in your bedroom or your home to help loosen secretions.  Avoid anything that causes you to cough at work or at home.  If your cough is worse at night, try sleeping in a semi-upright position.  Avoid cigarette smoke. If you smoke, quit smoking. If you need help quitting, ask your health care provider.  Avoid caffeine.  Avoid alcohol.  Rest as needed. Contact a health care provider if:  You have new symptoms.  You cough up pus.  Your cough does not get better after  2-3 weeks, or your cough gets worse.  You cannot control your cough with suppressant medicines and you are losing sleep.  You develop pain that is getting worse or pain that is not controlled with pain medicines.  You have a fever.  You have unexplained weight loss.  You have night sweats. Get help right away if:  You cough up blood.  You have difficulty breathing.  Your heartbeat is very fast. This information is not intended to replace advice given to you by your health care provider. Make sure you discuss any questions you have with your health care provider. Document Released: 04/03/2011 Document Revised: 03/12/2016 Document Reviewed: 12/12/2014 Elsevier Interactive Patient Education  2019 [employer].

## 2018-11-24 NOTE — Progress Notes (Signed)
Dear Dr. Doran HeaterMarcellino,   Thank you for referring Carolyn Glover to our clinic for chronic cough. I have attached the consult note and plan as seen below:   Subjective:   PATIENT ID: Carolyn Glover: female DOB: 09/13/1961, MRN: [id number]   HPI  Chief Complaint  Patient presents with  . Consult    chronic, nonproductive cough for about 4 months; went to ENT doctor & put on cough suppressant therapy (Tessalon); self-referred to LBP to rule out underlying cause   Mr. Carolyn Glover is a 58 year old never smoker who presents with chronic cough for four months.  She reports nonproductive chronic cough that began in October 2019 that was initially noticed by her co-workers. Since then she has had persistent non-productive cough that occurs throughout the day and has not waned. No nocturnal symptoms. No incidental illness or event preceding the cough. She will have coughing fit/spell <minute that occur daily and does not have any unidentifiable trigger. Seems to improve with sips of water or hard candy. Cough suppressants and steroids also help. Has tried brief trial of anti-reflux therapy without any change in cough but denies symptoms of heart burn, reflux or sour taste in the mouth.  Denies any childhood respiratory issues or prolonged respiratory illnesses that require significant medical attention. No asthma. Denies prior history of cough similar to this in the past.  Has a cat for ~1 year. Has had pets in the past and has eye watering and itchiness to long hair cats in the past. Denies frank congestion but will feel stuffy occasionally, which she attributes to outdoor allergies. No known other known allergens. Symptoms remained unchanged with travel.   Social History: She enjoys travelling. Recently came back from [place].  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   Environmental exposures: Denies any exposure to manufacturing  environments or any exposure to inhalants that she is aware of  I have personally reviewed patient's past medical/family/social history, allergies, current medications.  Past Medical History:  Diagnosis Date  . Anxiety   . Disorder of left Achilles tendon 08/22/2018  . Headache      Family History  Problem Relation Age of Onset  . Colon cancer Mother   . Breast cancer Neg Hx      Social History   Occupational History  . Not on file  Tobacco Use  . Smoking status: Never Smoker  . Smokeless tobacco: Never Used  Substance and Sexual Activity  . Alcohol use: Yes  . Drug use: No  . Sexual activity: Not on file    Allergies  Allergen Reactions  . Erythromycin Hives and Nausea And Vomiting     Outpatient Medications Prior to Visit  Medication Sig Dispense Refill  . benzonatate (TESSALON) 100 MG capsule benzonatate 100 mg capsule  Take 1 capsule every 8 hours by oral route as needed for 10 days.  for cough    . cycloSPORINE (RESTASIS) 0.05 % ophthalmic emulsion Place 1 drop into both eyes 2 (two) times daily as needed (dry eyes).    Marland Kitchen. estradiol (CLIMARA - DOSED IN MG/24 HR) 0.075 mg/24hr patch APPLY 1 PATCH TOPICALLY ONCE A WEEK  11  . Multiple Vitamin (MULTIVITAMIN WITH MINERALS) TABS tablet Take 1 tablet by mouth daily.    . progesterone (PROMETRIUM) 200 MG capsule TAKE 1 CAPSULE BY MOUTH DAILY FOR 1 WEEK EACH MONTH  3  . ALPRAZolam (XANAX) 0.5 MG tablet  Take 0.5 mg by mouth at bedtime as needed for sleep.   0  . buPROPion (WELLBUTRIN XL) 150 MG 24 hr tablet Take 150 mg by mouth daily.  2  . clonazePAM (KLONOPIN) 0.5 MG tablet Take 0.5 mg by mouth 2 (two) times daily as needed.  2  . Polyethyl Glycol-Propyl Glycol (SYSTANE OP) Apply 1 drop to eye daily as needed (dry eyes).     No facility-administered medications prior to visit.     Review of Systems  Constitutional: Negative for chills, diaphoresis, fever, malaise/fatigue and weight loss.  HENT: Positive for  congestion. Negative for sore throat.   Respiratory: Positive for cough. Negative for hemoptysis, sputum production, shortness of breath and wheezing.   Cardiovascular: Negative for chest pain, orthopnea, leg swelling and PND.  Gastrointestinal: Negative for abdominal pain, heartburn and nausea.  Musculoskeletal: Negative for joint pain and myalgias.  Skin: Negative for rash.  Neurological: Positive for headaches. Negative for dizziness and weakness.  Endo/Heme/Allergies: Positive for environmental allergies.  Psychiatric/Behavioral: Negative for depression. The patient is not nervous/anxious.     Objective:   Vitals:   11/24/18 0908  BP: 120/82  Pulse: 75  SpO2: 99%  Weight: 185 lb 6.4 oz (84.1 kg)  Height: 5\' 7"  (1.702 m)   SpO2: 99 % O2 Device: None (Room air)  Physical Exam General: Well-appearing, no acute distress HENT: [place], AT, OP clear, MMM Eyes: EOMI, no scleral icterus Respiratory: Clear to auscultation bilaterally.  No crackles, wheezing or rales Cardiovascular: RRR, -M/R/G, no JVD GI: BS+, soft, nontender Extremities:-Edema,-tenderness Neuro: AAO x4, CNII-XII grossly intact Skin: Intact, no rashes or bruising Psych: Normal mood, normal affect  Chest imaging: CXR 02/17/13 - No acute infiltrate, effusion or edema  PFT: None on file  Imaging, labs and tests noted above have been reviewed independently by me.    Assessment & Plan:   Chronic Cough  Discussion:  58 year old female with chronic cough with unclear etiology. Medication list reviewed and no causative agents seen. Upper airway cough syndrome considered with hx of cat hair/dander allergy and current pet living in the home. Reflux is possible however ENT did not visualize laryngeal irritation on scope exam. She has not a formal PFTs before and would be reasonable for work-up of her cough to rule out underlying obstructive or restrictive lung disease.  --Pulmonary function test ordered --If negative,  will follow-up with ENT  Orders Placed This Encounter  Procedures  . Pulmonary Function Test    Standing Status:   Future    Standing Expiration Date:   11/25/2019    Order Specific Question:   Where should this test be performed?    Answer:   [facility]    Order Specific Question:   Full PFT: includes the following: basic spirometry, spirometry pre & post bronchodilator, diffusion capacity (DLCO), lung volumes    Answer:   Full PFT    Order Specific Question:   MIP/MEP    Answer:   No    Order Specific Question:   6 minute walk    Answer:   No    Order Specific Question:   ABG    Answer:   No    Order Specific Question:   Diffusion capacity (DLCO)    Answer:   Yes    Order Specific Question:   Lung volumes    Answer:   Yes    Order Specific Question:   Methacholine challenge    Answer:   No  No orders of the defined types were placed in this encounter.   Return if symptoms worsen or fail to improve.  Wandalene Abrams Mechele Collin, MD [facility] 11/24/2018 9:55 AM  Personal pager: [phone] If unanswered, please page CCM On-call: #[phone]

## 2018-11-24 NOTE — Progress Notes (Signed)
PFT completed today.  

## 2018-11-26 NOTE — Progress Notes (Signed)
Please contact patient regarding PFT results.  PFTs are normal. No obstructive or restrictive abnormalities presents. Recommend patient to schedule return visit with ENT for further management as planned.  JE

## 2018-11-28 NOTE — Progress Notes (Signed)
Called spoke with patient, advised of PFT results / recs as stated by Dr Everardo All.  Pt verbalized understanding and denied any questions.

## 2019-09-19 ENCOUNTER — Other Ambulatory Visit: Payer: Self-pay | Admitting: Family Medicine

## 2019-09-19 DIAGNOSIS — Z1231 Encounter for screening mammogram for malignant neoplasm of breast: Secondary | ICD-10-CM

## 2019-11-08 ENCOUNTER — Other Ambulatory Visit: Payer: Self-pay

## 2019-11-08 ENCOUNTER — Ambulatory Visit
Admission: RE | Admit: 2019-11-08 | Discharge: 2019-11-08 | Disposition: A | Discharge status: Home / Self Care | Payer: Managed Care, Other (non HMO) | Source: Ambulatory Visit | Attending: Family Medicine | Admitting: Family Medicine

## 2019-11-08 DIAGNOSIS — Z1231 Encounter for screening mammogram for malignant neoplasm of breast: Secondary | ICD-10-CM

## 2019-11-09 ENCOUNTER — Other Ambulatory Visit: Payer: Self-pay | Admitting: Family Medicine

## 2019-11-09 DIAGNOSIS — R928 Other abnormal and inconclusive findings on diagnostic imaging of breast: Secondary | ICD-10-CM

## 2019-11-20 ENCOUNTER — Other Ambulatory Visit: Payer: Self-pay

## 2019-11-20 ENCOUNTER — Ambulatory Visit
Admission: RE | Admit: 2019-11-20 | Discharge: 2019-11-20 | Disposition: A | Discharge status: Home / Self Care | Payer: Managed Care, Other (non HMO) | Source: Ambulatory Visit | Attending: Family Medicine | Admitting: Family Medicine

## 2019-11-20 ENCOUNTER — Ambulatory Visit: Payer: Managed Care, Other (non HMO)

## 2019-11-20 DIAGNOSIS — R928 Other abnormal and inconclusive findings on diagnostic imaging of breast: Secondary | ICD-10-CM

## 2019-11-20 HISTORY — PX: ROTATOR CUFF REPAIR: SHX139

## 2020-03-21 ENCOUNTER — Other Ambulatory Visit: Payer: Self-pay | Admitting: Obstetrics & Gynecology

## 2020-03-28 NOTE — Patient Instructions (Signed)
GET YOUR COVID TEST AT [address] ON 6/17  At 11:00   Carolyn Glover       Your procedure is scheduled on 04/08/20   Report to [facility]  at 11:30 A.M.   Call this number if you have problems the morning of [phone]   OUR ADDRESS IS [address], WE ARE LOCATED IN THE [facility]ICAL [facility] WITH [facility].   Remember:   Do not eat food or drink liquids after midnight.   Take these medicines the morning of surgery with A SIP OF WATER:  Zyrtec and use your eye drops   Do not wear jewelry, make-up or nail polish.   Do not wear lotions, powders, or perfumes, or deoderant.   Do not shave 48 hours prior to surgery.     Do not bring valuables to the hospital.   [facility] is not responsible for any belongings or valuables.  Contacts, dentures or bridgework may not be worn into surgery. .  For patients admitted to the hospital, discharge time will be determined by your treatment team.  Patients discharged the day of surgery will not be allowed to drive home.   Special instructions:  Bring your prescription Meds in their original bottles  Please read over the following fact sheets that you were given:    [facility] - Preparing for Surgery  Before surgery, you can play an important role.   Because skin is not sterile, your skin needs to be as free of germs as possible.   You can reduce the number of germs on your skin by washing with CHG (chlorahexidine gluconate) soap before surgery.   CHG is an antiseptic cleaner which kills germs and bonds with the skin to continue killing germs even after washing. Please DO NOT use if you have an allergy to CHG or antibacterial soaps.   If your skin becomes reddened/irritated stop using the CHG and inform your nurse when you arrive at Short Stay. Do not shave (including legs and underarms) for at least 48 hours prior to the first CHG shower.   . Please follow these instructions carefully:  1.   Shower with CHG Soap the night before surgery and the  morning of Surgery.  2.  If you choose to wash your hair, wash your hair first as usual with your  normal  shampoo.  3.  After you shampoo, rinse your hair and body thoroughly to remove the  shampoo.                                        4.  Use CHG as you would any other liquid soap.  You can apply chg directly  to the skin and wash                       Gently with a scrungie or clean washcloth.  5.  Apply the CHG Soap to your body ONLY FROM THE NECK DOWN.   Do not use on face/ open                           Wound or open sores. Avoid contact with eyes, ears mouth and genitals (private parts).  Wash face,  Genitals (private parts) with your normal soap.             6.  Wash thoroughly, paying special attention to the area where your surgery  will be performed.  7.  Thoroughly rinse your body with warm water from the neck down.  8.  DO NOT shower/wash with your normal soap after using and rinsing off  the CHG Soap.             9.  Pat yourself dry with a clean towel.            10.  Wear clean pajamas.            11.  Place clean sheets on your bed the night of your first shower and do not  sleep with pets. Day of Surgery : Do not apply any lotions/deodorants the morning of surgery.  Please wear clean clothes to the hospital/surgery center.  FAILURE TO FOLLOW THESE INSTRUCTIONS MAY RESULT IN THE CANCELLATION OF YOUR SURGERY PATIENT SIGNATURE_________________________________  NURSE SIGNATURE__________________________________  ________________________________________________________________________

## 2020-04-01 ENCOUNTER — Other Ambulatory Visit: Payer: Self-pay

## 2020-04-01 ENCOUNTER — Encounter (HOSPITAL_COMMUNITY)
Admission: RE | Admit: 2020-04-01 | Discharge: 2020-04-01 | Disposition: A | Discharge status: Home / Self Care | Payer: Managed Care, Other (non HMO) | Source: Ambulatory Visit | Attending: Obstetrics & Gynecology | Admitting: Obstetrics & Gynecology

## 2020-04-01 ENCOUNTER — Encounter (HOSPITAL_COMMUNITY): Payer: Self-pay

## 2020-04-01 ENCOUNTER — Other Ambulatory Visit (HOSPITAL_COMMUNITY): Payer: Managed Care, Other (non HMO)

## 2020-04-01 DIAGNOSIS — Z01818 Encounter for other preprocedural examination: Secondary | ICD-10-CM | POA: Insufficient documentation

## 2020-04-01 HISTORY — DX: Anemia, unspecified: D64.9

## 2020-04-01 LAB — CBC
HCT: 31.4 % — ABNORMAL LOW (ref 36.0–46.0)
Hemoglobin: 9.3 g/dL — ABNORMAL LOW (ref 12.0–15.0)
MCH: 23.2 pg — ABNORMAL LOW (ref 26.0–34.0)
MCHC: 29.6 g/dL — ABNORMAL LOW (ref 30.0–36.0)
MCV: 78.3 fL — ABNORMAL LOW (ref 80.0–100.0)
Platelets: 396 10*3/uL (ref 150–400)
RBC: 4.01 MIL/uL (ref 3.87–5.11)
RDW: 17.5 % — ABNORMAL HIGH (ref 11.5–15.5)
WBC: 3.9 10*3/uL — ABNORMAL LOW (ref 4.0–10.5)
nRBC: 0 % (ref 0.0–0.2)

## 2020-04-01 LAB — BASIC METABOLIC PANEL
Anion gap: 6 (ref 5–15)
BUN: 15 mg/dL (ref 6–20)
CO2: 25 mmol/L (ref 22–32)
Calcium: 9.1 mg/dL (ref 8.9–10.3)
Chloride: 110 mmol/L (ref 98–111)
Creatinine, Ser: 0.67 mg/dL (ref 0.44–1.00)
GFR calc Af Amer: >60 mL/min (ref >60)
GFR calc non Af Amer: >60 mL/min (ref >60)
Glucose, Bld: 98 mg/dL (ref 70–99)
Potassium: 4.5 mmol/L (ref 3.5–5.1)
Sodium: 141 mmol/L (ref 135–145)

## 2020-04-01 LAB — ABO/RH: ABO/RH(D): O POS

## 2020-04-01 NOTE — Progress Notes (Signed)
COVID Vaccine Completed:Yes Date COVID Vaccine completed:01/23/20 COVID vaccine manufacturer: Pfizer     PCP - Terie Purser PA Cardiologist - Pt can't remember  Chest x-ray - no  EKG - 04/01/20 Stress Test - no ECHO - no Cardiac Cath - no  Sleep Study - no CPAP -   Fasting Blood Sugar - NA Checks Blood Sugar _____ times a day  Blood Thinner Instructions:NA Aspirin Instructions: Last Dose:  Anesthesia review:   Patient denies shortness of breath, fever, cough and chest pain at PAT appointment  yes Patient verbalized understanding of instructions that were given to them at the PAT appointment. Patient was also instructed that they will need to review over the PAT instructions again at home before surgery. Yes   Pt had a cardiac ablation for atrial tachycardia in 2009. Pt can't recall where or who did the procedure. She reports no problems since.

## 2020-04-02 NOTE — Progress Notes (Signed)
Spoke with Jodell Cipro pa, patient meets [facility] surgery guidelines per jessica zanetto pa.

## 2020-04-04 ENCOUNTER — Other Ambulatory Visit (HOSPITAL_COMMUNITY)
Admission: RE | Admit: 2020-04-04 | Discharge: 2020-04-04 | Disposition: A | Discharge status: Home / Self Care | Payer: Managed Care, Other (non HMO) | Source: Ambulatory Visit | Attending: Obstetrics & Gynecology | Admitting: Obstetrics & Gynecology

## 2020-04-04 DIAGNOSIS — Z01812 Encounter for preprocedural laboratory examination: Secondary | ICD-10-CM | POA: Insufficient documentation

## 2020-04-04 DIAGNOSIS — Z20822 Contact with and (suspected) exposure to covid-19: Secondary | ICD-10-CM | POA: Insufficient documentation

## 2020-04-04 LAB — SARS CORONAVIRUS 2 (TAT 6-24 HRS): SARS Coronavirus 2: NEGATIVE

## 2020-04-08 ENCOUNTER — Ambulatory Visit (HOSPITAL_BASED_OUTPATIENT_CLINIC_OR_DEPARTMENT_OTHER)
Admission: RE | Admit: 2020-04-08 | Discharge: 2020-04-08 | Disposition: A | Discharge status: Home / Self Care | Payer: Managed Care, Other (non HMO) | Attending: Obstetrics & Gynecology | Admitting: Obstetrics & Gynecology

## 2020-04-08 ENCOUNTER — Ambulatory Visit (HOSPITAL_BASED_OUTPATIENT_CLINIC_OR_DEPARTMENT_OTHER): Payer: Managed Care, Other (non HMO) | Admitting: Anesthesiology

## 2020-04-08 ENCOUNTER — Encounter (HOSPITAL_BASED_OUTPATIENT_CLINIC_OR_DEPARTMENT_OTHER): Payer: Self-pay | Admitting: Obstetrics & Gynecology

## 2020-04-08 ENCOUNTER — Ambulatory Visit (HOSPITAL_BASED_OUTPATIENT_CLINIC_OR_DEPARTMENT_OTHER): Payer: Managed Care, Other (non HMO) | Admitting: Physician Assistant

## 2020-04-08 ENCOUNTER — Other Ambulatory Visit: Payer: Self-pay

## 2020-04-08 ENCOUNTER — Encounter (HOSPITAL_BASED_OUTPATIENT_CLINIC_OR_DEPARTMENT_OTHER)
Admission: RE | Disposition: A | Discharge status: Home / Self Care | Payer: Self-pay | Source: Home / Self Care | Attending: Obstetrics & Gynecology

## 2020-04-08 DIAGNOSIS — N871 Moderate cervical dysplasia: Secondary | ICD-10-CM | POA: Diagnosis present

## 2020-04-08 DIAGNOSIS — N950 Postmenopausal bleeding: Secondary | ICD-10-CM | POA: Insufficient documentation

## 2020-04-08 DIAGNOSIS — D069 Carcinoma in situ of cervix, unspecified: Secondary | ICD-10-CM | POA: Insufficient documentation

## 2020-04-08 DIAGNOSIS — Z90710 Acquired absence of both cervix and uterus: Secondary | ICD-10-CM | POA: Diagnosis present

## 2020-04-08 DIAGNOSIS — N879 Dysplasia of cervix uteri, unspecified: Secondary | ICD-10-CM | POA: Diagnosis present

## 2020-04-08 HISTORY — PX: ROBOTIC ASSISTED TOTAL HYSTERECTOMY WITH BILATERAL SALPINGO OOPHERECTOMY: SHX6086

## 2020-04-08 LAB — TYPE AND SCREEN
ABO/RH(D): O POS
Antibody Screen: NEGATIVE

## 2020-04-08 SURGERY — HYSTERECTOMY, TOTAL, ROBOT-ASSISTED, LAPAROSCOPIC, WITH BILATERAL SALPINGO-OOPHORECTOMY
Anesthesia: General | Laterality: Bilateral

## 2020-04-08 MED ORDER — TRAMADOL HCL 50 MG PO TABS
ORAL_TABLET | ORAL | Status: AC
  Filled 2020-04-08: qty 1

## 2020-04-08 MED ORDER — LIDOCAINE 2% (20 MG/ML) 5 ML SYRINGE
INTRAMUSCULAR | Status: DC | PRN
  Administered 2020-04-08: 60 mg via INTRAVENOUS

## 2020-04-08 MED ORDER — CEFAZOLIN SODIUM-DEXTROSE 2-4 GM/100ML-% IV SOLN
INTRAVENOUS | Status: AC
  Filled 2020-04-08: qty 100

## 2020-04-08 MED ORDER — FENTANYL CITRATE (PF) 100 MCG/2ML IJ SOLN
INTRAMUSCULAR | Status: AC
  Filled 2020-04-08: qty 2

## 2020-04-08 MED ORDER — SUGAMMADEX SODIUM 200 MG/2ML IV SOLN
INTRAVENOUS | Status: DC | PRN
  Administered 2020-04-08: 200 mg via INTRAVENOUS

## 2020-04-08 MED ORDER — ROCURONIUM BROMIDE 10 MG/ML (PF) SYRINGE
PREFILLED_SYRINGE | INTRAVENOUS | Status: DC | PRN
  Administered 2020-04-08: 60 mg via INTRAVENOUS
  Administered 2020-04-08: 20 mg via INTRAVENOUS

## 2020-04-08 MED ORDER — SODIUM CHLORIDE 0.9 % IR SOLN
Status: DC | PRN
  Administered 2020-04-08: 3000 mL

## 2020-04-08 MED ORDER — MENTHOL 3 MG MT LOZG
LOZENGE | OROMUCOSAL | Status: AC
  Filled 2020-04-08: qty 9

## 2020-04-08 MED ORDER — FENTANYL CITRATE (PF) 100 MCG/2ML IJ SOLN
INTRAMUSCULAR | Status: DC | PRN
  Administered 2020-04-08: 50 ug via INTRAVENOUS
  Administered 2020-04-08: 100 ug via INTRAVENOUS
  Administered 2020-04-08: 50 ug via INTRAVENOUS

## 2020-04-08 MED ORDER — ACETAMINOPHEN 325 MG PO TABS: 650.0000 mg | ORAL_TABLET | Freq: Four times a day (QID) | ORAL | Status: DC | PRN

## 2020-04-08 MED ORDER — KETOROLAC TROMETHAMINE 30 MG/ML IJ SOLN: 30.0000 mg | Freq: Once | INTRAMUSCULAR | Status: DC

## 2020-04-08 MED ORDER — ONDANSETRON HCL 4 MG PO TABS: 4.0000 mg | ORAL_TABLET | Freq: Four times a day (QID) | ORAL | Status: DC | PRN

## 2020-04-08 MED ORDER — PROPOFOL 10 MG/ML IV BOLUS
INTRAVENOUS | Status: DC | PRN
  Administered 2020-04-08: 160 mg via INTRAVENOUS

## 2020-04-08 MED ORDER — ONDANSETRON HCL 4 MG/2ML IJ SOLN
INTRAMUSCULAR | Status: AC
  Filled 2020-04-08: qty 2

## 2020-04-08 MED ORDER — PHENYLEPHRINE 40 MCG/ML (10ML) SYRINGE FOR IV PUSH (FOR BLOOD PRESSURE SUPPORT)
PREFILLED_SYRINGE | INTRAVENOUS | Status: DC | PRN
  Administered 2020-04-08: 80 ug via INTRAVENOUS

## 2020-04-08 MED ORDER — PROMETHAZINE HCL 25 MG/ML IJ SOLN: 6.2500 mg | INTRAMUSCULAR | Status: DC | PRN

## 2020-04-08 MED ORDER — ACETAMINOPHEN 500 MG PO TABS
1000.0000 mg | ORAL_TABLET | Freq: Once | ORAL | Status: AC
  Administered 2020-04-08: 1000 mg via ORAL

## 2020-04-08 MED ORDER — CEFAZOLIN SODIUM-DEXTROSE 2-4 GM/100ML-% IV SOLN
2.0000 g | INTRAVENOUS | Status: AC
  Administered 2020-04-08: 2 g via INTRAVENOUS

## 2020-04-08 MED ORDER — DEXAMETHASONE SODIUM PHOSPHATE 10 MG/ML IJ SOLN
INTRAMUSCULAR | Status: AC
  Filled 2020-04-08: qty 1

## 2020-04-08 MED ORDER — OXYCODONE HCL 5 MG PO TABS: 5.0000 mg | ORAL_TABLET | Freq: Once | ORAL | Status: DC | PRN

## 2020-04-08 MED ORDER — LACTATED RINGERS IV SOLN: INTRAVENOUS | Status: DC

## 2020-04-08 MED ORDER — MIDAZOLAM HCL 2 MG/2ML IJ SOLN
INTRAMUSCULAR | Status: AC
  Filled 2020-04-08: qty 2

## 2020-04-08 MED ORDER — ROCURONIUM BROMIDE 10 MG/ML (PF) SYRINGE
PREFILLED_SYRINGE | INTRAVENOUS | Status: AC
  Filled 2020-04-08: qty 10

## 2020-04-08 MED ORDER — ONDANSETRON HCL 4 MG/2ML IJ SOLN
INTRAMUSCULAR | Status: DC | PRN
  Administered 2020-04-08 (×2): 4 mg via INTRAVENOUS

## 2020-04-08 MED ORDER — OXYCODONE HCL 5 MG/5ML PO SOLN: 5.0000 mg | Freq: Once | ORAL | Status: DC | PRN

## 2020-04-08 MED ORDER — TRAMADOL HCL 50 MG PO TABS
50.0000 mg | ORAL_TABLET | Freq: Four times a day (QID) | ORAL | Status: DC | PRN
  Administered 2020-04-08: 50 mg via ORAL

## 2020-04-08 MED ORDER — ONDANSETRON HCL 4 MG/2ML IJ SOLN: 4.0000 mg | Freq: Four times a day (QID) | INTRAMUSCULAR | Status: DC | PRN

## 2020-04-08 MED ORDER — MIDAZOLAM HCL 2 MG/2ML IJ SOLN
INTRAMUSCULAR | Status: DC | PRN
  Administered 2020-04-08: 2 mg via INTRAVENOUS

## 2020-04-08 MED ORDER — MENTHOL 3 MG MT LOZG
1.0000 | LOZENGE | OROMUCOSAL | Status: DC | PRN
  Administered 2020-04-08: 3 mg via ORAL

## 2020-04-08 MED ORDER — ACETAMINOPHEN 500 MG PO TABS
ORAL_TABLET | ORAL | Status: AC
  Filled 2020-04-08: qty 2

## 2020-04-08 MED ORDER — DIPHENHYDRAMINE HCL 50 MG/ML IJ SOLN
INTRAMUSCULAR | Status: DC | PRN
Start: 2020-04-08 — End: 2020-04-08
  Administered 2020-04-08: 12.5 mg via INTRAVENOUS

## 2020-04-08 MED ORDER — LIDOCAINE 2% (20 MG/ML) 5 ML SYRINGE
INTRAMUSCULAR | Status: AC
  Filled 2020-04-08: qty 5

## 2020-04-08 MED ORDER — KETOROLAC TROMETHAMINE 30 MG/ML IJ SOLN
INTRAMUSCULAR | Status: DC | PRN
Start: 2020-04-08 — End: 2020-04-08
  Administered 2020-04-08: 30 mg via INTRAVENOUS

## 2020-04-08 MED ORDER — SODIUM CHLORIDE 0.9 % IV SOLN
INTRAVENOUS | Status: DC | PRN
  Administered 2020-04-08: 60 mL

## 2020-04-08 MED ORDER — FENTANYL CITRATE (PF) 100 MCG/2ML IJ SOLN: 25.0000 ug | INTRAMUSCULAR | Status: DC | PRN

## 2020-04-08 SURGICAL SUPPLY — 57 items
APPLICATOR ARISTA FLEXITIP XL (MISCELLANEOUS) IMPLANT
BARRIER ADHS 3X4 INTERCEED (GAUZE/BANDAGES/DRESSINGS) IMPLANT
CATH FOLEY 3WAY  5CC 16FR (CATHETERS) ×1
CATH FOLEY 3WAY 5CC 16FR (CATHETERS) ×1 IMPLANT
COVER BACK TABLE 60X90IN (DRAPES) ×2 IMPLANT
COVER TIP SHEARS 8 DVNC (MISCELLANEOUS) ×1 IMPLANT
COVER TIP SHEARS 8MM DA VINCI (MISCELLANEOUS) ×1
DECANTER SPIKE VIAL GLASS SM (MISCELLANEOUS) ×4 IMPLANT
DEFOGGER SCOPE WARMER CLEARIFY (MISCELLANEOUS) ×2 IMPLANT
DERMABOND ADVANCED (GAUZE/BANDAGES/DRESSINGS) ×1
DERMABOND ADVANCED .7 DNX12 (GAUZE/BANDAGES/DRESSINGS) ×1 IMPLANT
DRAPE ARM DVNC X/XI (DISPOSABLE) ×4 IMPLANT
DRAPE COLUMN DVNC XI (DISPOSABLE) ×1 IMPLANT
DRAPE DA VINCI XI ARM (DISPOSABLE) ×4
DRAPE DA VINCI XI COLUMN (DISPOSABLE) ×1
DURAPREP 26ML APPLICATOR (WOUND CARE) ×2 IMPLANT
ELECT REM PT RETURN 9FT ADLT (ELECTROSURGICAL) ×2
ELECTRODE REM PT RTRN 9FT ADLT (ELECTROSURGICAL) ×1 IMPLANT
GAUZE 4X4 16PLY RFD (DISPOSABLE) ×2 IMPLANT
GAUZE PETROLATUM 1 X8 (GAUZE/BANDAGES/DRESSINGS) IMPLANT
GLOVE BIO SURGEON STRL SZ7 (GLOVE) ×6 IMPLANT
GLOVE BIOGEL PI IND STRL 7.0 (GLOVE) ×5 IMPLANT
GLOVE BIOGEL PI INDICATOR 7.0 (GLOVE) ×5
HEMOSTAT ARISTA ABSORB 3G PWDR (HEMOSTASIS) IMPLANT
IRRIG SUCT STRYKERFLOW 2 WTIP (MISCELLANEOUS) ×2
IRRIGATION SUCT STRKRFLW 2 WTP (MISCELLANEOUS) ×1 IMPLANT
LEGGING LITHOTOMY PAIR STRL (DRAPES) ×2 IMPLANT
OBTURATOR OPTICAL STANDARD 8MM (TROCAR) ×1
OBTURATOR OPTICAL STND 8 DVNC (TROCAR) ×1
OBTURATOR OPTICALSTD 8 DVNC (TROCAR) ×1 IMPLANT
OCCLUDER COLPOPNEUMO (BALLOONS) ×2 IMPLANT
PACK ROBOT WH (CUSTOM PROCEDURE TRAY) ×2 IMPLANT
PACK ROBOTIC GOWN (GOWN DISPOSABLE) ×2 IMPLANT
PACK TRENDGUARD 450 HYBRID PRO (MISCELLANEOUS) ×1 IMPLANT
PAD PREP 24X48 CUFFED NSTRL (MISCELLANEOUS) ×2 IMPLANT
PROTECTOR NERVE ULNAR (MISCELLANEOUS) ×2 IMPLANT
SEAL CANN UNIV 5-8 DVNC XI (MISCELLANEOUS) ×3 IMPLANT
SEAL XI 5MM-8MM UNIVERSAL (MISCELLANEOUS) ×3
SEALER VESSEL DA VINCI XI (MISCELLANEOUS)
SEALER VESSEL EXT DVNC XI (MISCELLANEOUS) IMPLANT
SET IRRIG Y TYPE TUR BLADDER L (SET/KITS/TRAYS/PACK) IMPLANT
SET TRI-LUMEN FLTR TB AIRSEAL (TUBING) ×2 IMPLANT
SUT VICRYL 0 UR6 27IN ABS (SUTURE) ×2 IMPLANT
SUT VICRYL 4-0 PS2 18IN ABS (SUTURE) ×4 IMPLANT
SUT VLOC 180 0 9IN  GS21 (SUTURE) ×1
SUT VLOC 180 0 9IN GS21 (SUTURE) ×1 IMPLANT
TIP RUMI ORANGE 6.7MMX12CM (TIP) IMPLANT
TIP UTERINE 5.1X6CM LAV DISP (MISCELLANEOUS) IMPLANT
TIP UTERINE 6.7X10CM GRN DISP (MISCELLANEOUS) IMPLANT
TIP UTERINE 6.7X6CM WHT DISP (MISCELLANEOUS) IMPLANT
TIP UTERINE 6.7X8CM BLUE DISP (MISCELLANEOUS) IMPLANT
TOWEL OR 17X26 10 PK STRL BLUE (TOWEL DISPOSABLE) ×2 IMPLANT
TRAY FOL W/BAG SLVR 16FR STRL (SET/KITS/TRAYS/PACK) IMPLANT
TRAY FOLEY W/BAG SLVR 16FR LF (SET/KITS/TRAYS/PACK)
TRENDGUARD 450 HYBRID PRO PACK (MISCELLANEOUS) ×2
TROCAR PORT AIRSEAL 5X120 (TROCAR) ×2 IMPLANT
WATER STERILE IRR 1000ML POUR (IV SOLUTION) ×2 IMPLANT

## 2020-04-08 NOTE — Transfer of Care (Addendum)
Immediate Anesthesia Transfer of Care Note  Patient: Carolyn Glover  Procedure(s) Performed: XI ROBOTIC ASSISTED TOTAL HYSTERECTOMY WITH BILATERAL SALPINGO OOPHORECTOMY (Bilateral )  Patient Location: PACU  Anesthesia Type:General  Level of Consciousness: awake, drowsy and patient cooperative  Airway & Oxygen Therapy: Patient Spontanous Breathing and Patient connected to nasal cannula oxygen  Post-op Assessment: Report given to RN and Post -op Vital signs reviewed and stable  Post vital signs: Reviewed and stable  Last Vitals:  Vitals Value Taken Time  BP 137/84 04/08/20 1553  Temp    Pulse 63 04/08/20 1555  Resp 10 04/08/20 1555  SpO2 100 % 04/08/20 1555  Vitals shown include unvalidated device data.  Last Pain:  Vitals:   04/08/20 1202  TempSrc: Oral  PainSc: 0-No pain      Patients Stated Pain Goal: 5 (04/08/20 1202)  Complications: No complications documented.

## 2020-04-08 NOTE — Discharge Summary (Signed)
Physician Discharge Summary  Patient ID: Carolyn Glover MRN: [id number] DOB/AGE: [birth year] 59 y.o.  Admit date: 04/08/2020 Discharge date: 04/08/2020  Admission Diagnoses: CIN II/ HSIL,    Discharge Diagnoses S/p daVinci assisted total laparoscopic hysterectomy and bilateral salpingectomy   Discharged Condition: Stable/ improved. No post-op concerns   Hospital Course: Uncomplicated surgery and post-op observation. Discharge at 6 hrs   Discharge Exam: Blood pressure (!) 143/85, pulse (!) 57, temperature 97.8 F (36.6 C), resp. rate 15, height 5\' 7"  (1.702 m), weight 76.5 kg, SpO2 100 %. RN reports pt doing well. Tolerated diet, ambulating and voided. Pain well controlled.  BP 129/74 (BP Location: Right Arm)   Pulse 66   Temp 98.8 F (37.1 C)   Resp 16   Ht 5\' 7"  (1.702 m)   Wt 76.5 kg   SpO2 100%   BMI 26.42 kg/m   Disposition: Discharge disposition: 01-Home or Self Care       Discharge Instructions    Call MD for:   Complete by: As directed    Vaginal bleeding other than light spots of blood   Call MD for:  difficulty breathing, headache or visual disturbances   Complete by: As directed    Call MD for:  extreme fatigue   Complete by: As directed    Call MD for:  hives   Complete by: As directed    Call MD for:  persistant dizziness or light-headedness   Complete by: As directed    Call MD for:  persistant nausea and vomiting   Complete by: As directed    Call MD for:  redness, tenderness, or signs of infection (pain, swelling, redness, odor or green/yellow discharge around incision site)   Complete by: As directed    Call MD for:  severe uncontrolled pain   Complete by: As directed    Call MD for:  temperature >100.4   Complete by: As directed    Diet - low sodium heart healthy   Complete by: As directed    Discharge instructions   Complete by: As directed    Three prescriptions sent to your pharmacy 1) Tylenol 650 mg every 6 hrs as needed for mild pain 2)  Ibuprofen 600 mg every 6 hrs as needed for mild to moderate pain 3) Ultram 50mg  every 6 -8 hrs as needed for severe pain Take Colace/ stool sofner or eat lost of fiber to prevent constipation   Driving Restrictions   Complete by: As directed    1-2 weeks as needed   Increase activity slowly   Complete by: As directed    Lifting restrictions   Complete by: As directed    No more than 20 lbs for 6 weeks   No dressing needed   Complete by: As directed    Sexual Activity Restrictions   Complete by: As directed    6-8 weeks depending on post-op checks     Allergies as of 04/08/2020      Reactions   Erythromycin Nausea And Vomiting   Morphine And Related Itching   Prednisone    Altered mental state      Medication List    TAKE these medications   cetirizine 10 MG tablet Commonly known as: ZYRTEC Take 10 mg by mouth daily as needed for allergies.   cycloSPORINE 0.05 % ophthalmic emulsion Commonly known as: RESTASIS Place 1 drop into both eyes 2 (two) times daily.   diphenhydrAMINE 25 MG tablet Commonly known as: BENADRYL Take 25 mg  by mouth at bedtime as needed for allergies (cough).   ibuprofen 200 MG tablet Commonly known as: ADVIL Take 400 mg by mouth every 6 (six) hours as needed for headache or moderate pain.   Probiotic Caps Take 1 capsule by mouth daily.   progesterone 200 MG capsule Commonly known as: PROMETRIUM Take 200 mg by mouth at bedtime.   SUMAtriptan 50 MG tablet Commonly known as: IMITREX Take 50 mg by mouth every 2 (two) hours as needed for migraine. May repeat in 2 hours if headache persists or recurs.            Discharge Care Instructions  (From admission, onward)         Start     Ordered   04/08/20 0000  No dressing needed        04/08/20 2131          Follow-up Information    Shea Evans, MD Follow up in 2 week(s).   Specialty: Obstetrics and Gynecology Contact information: [address] [place]  [address] [phone]               Signed: Robley Fries 04/08/2020, 9:32 PM

## 2020-04-08 NOTE — Anesthesia Preprocedure Evaluation (Addendum)
Anesthesia Evaluation  Patient identified by MRN, date of birth, ID band Patient awake    Reviewed: Allergy & Precautions, NPO status , Patient's Chart, lab work & pertinent test results  History of Anesthesia Complications Negative for: history of anesthetic complications  Airway Mallampati: II  TM Distance: >3 FB Neck ROM: Full    Dental  (+) Dental Advisory Given   Pulmonary neg pulmonary ROS,    Pulmonary exam normal        Cardiovascular Normal cardiovascular exam+ dysrhythmias (s/p ablation for PAT)      Neuro/Psych  Headaches, negative psych ROS   GI/Hepatic negative GI ROS, Neg liver ROS,   Endo/Other  negative endocrine ROS  Renal/GU negative Renal ROS  negative genitourinary   Musculoskeletal negative musculoskeletal ROS (+)   Abdominal   Peds  Hematology  (+) anemia , Hgb 9.3   Anesthesia Other Findings Covid test negative   Reproductive/Obstetrics Cervical dysplasia                           Anesthesia Physical Anesthesia Plan  ASA: II  Anesthesia Plan: General   Post-op Pain Management:    Induction: Intravenous  PONV Risk Score and Plan: 4 or greater and Treatment may vary due to age or medical condition, Ondansetron, Dexamethasone, Midazolam and Scopolamine patch - Pre-op  Airway Management Planned: Oral ETT  Additional Equipment: None  Intra-op Plan:   Post-operative Plan: Extubation in OR  Informed Consent: I have reviewed the patients History and Physical, chart, labs and discussed the procedure including the risks, benefits and alternatives for the proposed anesthesia with the patient or authorized representative who has indicated his/her understanding and acceptance.     Dental advisory given  Plan Discussed with: CRNA and Anesthesiologist  Anesthesia Plan Comments:       Anesthesia Quick Evaluation

## 2020-04-08 NOTE — Discharge Instructions (Signed)

## 2020-04-08 NOTE — Anesthesia Procedure Notes (Signed)
Procedure Name: Intubation Date/Time: 04/08/2020 1:32 PM Performed by: Pearson Grippe, CRNA Pre-anesthesia Checklist: Patient identified, Emergency Drugs available, Suction available and Patient being monitored Patient Re-evaluated:Patient Re-evaluated prior to induction Oxygen Delivery Method: Circle system utilized Preoxygenation: Pre-oxygenation with 100% oxygen Induction Type: IV induction Ventilation: Mask ventilation without difficulty Laryngoscope Size: Miller and 2 Grade View: Grade I Tube type: Oral Tube size: 7.0 mm Number of attempts: 1 Airway Equipment and Method: Stylet Placement Confirmation: ETT inserted through vocal cords under direct vision,  positive ETCO2 and breath sounds checked- equal and bilateral Secured at: 22 cm Tube secured with: Tape Dental Injury: Teeth and Oropharynx as per pre-operative assessment

## 2020-04-08 NOTE — H&P (Addendum)
Carolyn Glover is an 59 y.o. female here for hysterectomy for CIN II/ HSIL in endocervix with positive endocervical extension on LEEP.   Pt first presented for postmenopausal bleeding in May''20. She had been on HRT since May'19, ERT patch and PO Progesterone 200mg  7 days each month without monthly withdrawal bleeding. Also has testosteone pellet.  Office exam noted a large cervical polyp, that was removed, path- benign. Bleeding resolved after. Pap was ASCUS/ HPV+, Colpo bx- rare atypical cells, EBX neg. Sono noted fibroids, possible endometrial polyp. Hysteroscopy was scheduled in May'20 but cancelled due to Covid guidelines to proceed with only emergency surgery and bleeding had resolved, so was thought to be from cervical polyp.  Bleeding recurred in Feb'21. Sono repeated, multiple fibroids, possible polyp.  Hysteroscopy, D&C done in office Mar'21 --> benign endometrial cells but noted CIN II-III on D&C path.  LEEP done Apr'21 --> HSIL/ CIN II, positive endocervical extension but negative margins. Hysterectomy was advised and she agreed, but she had post-LEEP cervical infection and needed to wait for it to heal well to reduce intra-op complications due to inflammation.   She had no prior abn Paps, was getting q 3 yrs at PCP.  Has Anemia. Fibroids with myomectomy in past.  G4P0040 (2 TABs, 2 SABs).  No breast complaints, past hx of breast bx- benign. Nl recent mammograms.     Past Medical History:  Diagnosis Date  . Anemia    as a young woman  . Dysrhythmia 2009   ablation for PAT  . Headache     Past Surgical History:  Procedure Laterality Date  . BREAST CYST EXCISION Left    No scar seen   . BUNIONECTOMY Bilateral 1980  . COLONOSCOPY N/A 12/07/2016   Procedure: COLONOSCOPY;  Surgeon: Danie Binder, MD;  Location: [facility];  Service: Endoscopy;  Laterality: N/A;  8:30  . EYE SURGERY  2004, 2010   lasix,   . GANGLION CYST EXCISION Left 1997  . ROTATOR CUFF REPAIR Left  11/2019  . TONSILLECTOMY     59 years old  . UTERINE FIBROID SURGERY  mid 22    Family History  Problem Relation Age of Onset  . Colon cancer Mother   . Breast cancer Maternal Grandmother        diagnosed in her 11's    Social History:  reports that she has never smoked. She has never used smokeless tobacco. She reports current alcohol use. She reports that she does not use drugs.  Allergies:  Allergies  Allergen Reactions  . Erythromycin Nausea And Vomiting  . Morphine And Related Itching  . Prednisone     Altered mental state    No medications prior to admission.    Review of Systems neg for pelvic pain/ incontinence/ bladder or bowel changes/ wt loss or change in appetite.  There were no vitals taken for this visit. Physical Exam Physical exam:  A&O x 3, no acute distress. Pleasant HEENT neg, no thyromegaly Lungs CTA bilat CV RRR, S1S2 normal Abdo soft, non tender, non acute Extr no edema/ tenderness Pelvic Bulky uterus 10wks, multiple fibroids. Normal small ovaries. Mobility good.   Office sono- Fibroid uteruys 180 cc, about 10x8x7 cm, no adnexal masses.   CBC Latest Ref Rng & Units 04/01/2020  WBC 4.0 - 10.5 K/uL 3.9(L)  Hemoglobin 12.0 - 15.0 g/dL 9.3(L)  Hematocrit 36 - 46 % 31.4(L)  Platelets 150 - 400 K/uL 396   CMP Latest Ref Rng & Units  04/01/2020  Glucose 70 - 99 mg/dL 98  BUN 6 - 20 mg/dL 15  Creatinine 0.40 - 4.59 mg/dL 1.36  Sodium 859 - 923 mmol/L 141  Potassium 3.5 - 5.1 mmol/L 4.5  Chloride 98 - 111 mmol/L 110  CO2 22 - 32 mmol/L 25  Calcium 8.9 - 10.3 mg/dL 9.1    Assessment/Plan:  59 yo female with CIN II/HSIL on LEEP with positive endocervical extension. Here for hysterectomy- davinci assisted TLH/ BSO.  Menopausal bleeding, endometrial bx benign, has fibroids.  Risks/complications of surgery reviewed incl infection, bleeding, damage to internal organs including bladder, bowels, ureters, blood vessels, other risks from anesthesia, VTE  and delayed complications of any surgery, complications in future surgery reviewed.   Robley Fries 04/08/2020, 10:12 AM   Addendum-- Pt seen, assessed, H&P and surgery plan and r/c discussed, agrees.  V.Jahlon Baines MD

## 2020-04-08 NOTE — Op Note (Signed)
Preoperative diagnosis: Cervical dysplasia, CIN II with endocervical extension. Postmenopausal bleeding.  Postop diagnosis: Same Procedure: da Vinci robot assisted total laparoscopic hysterectomy and bilateral salpingoophorectomy Anesthesia Gen. Endotracheal Surgeon: Dr. Shea Evans Assistant: Karmen Stabs, RNFA IV fluids: 1200 cc LR EBL: 50 cc Urine output: 200 cc, clear Complications: none Pathology: Uterus with fibroids. cervix and both ovaries and fallopian tubes - 279.5 gms Disposition: PACU, stable Findings: Enlarged fibroid uterus and small normal ovaries and fallopian tubes. Normal gross appearance of bowel and omentum.   Procedure:  Indication: CIN II/ HSIL on LEEP with positive endocervical extension. Patient is menopause, Fibroid uterus. Postmenopausal bleeding, recurrent, endometrial biopsy benign/ polyp.  Hysterectomy and BSO was advised considering age. With prior myomectomy history, plan was to use davinci robot for hysterectomy. Complications of surgery including infection, bleeding, damage to internal organs and other surgery related problems including pneumonia, VTE reviewed and informed written consent was obtained. She understood and gave informed written consent.   Patient was brought to the operating room with IV running. She received 2 gm Ancef . Underwent general anesthesia without difficulty and was given dorsal lithotomy position, prepped and draped in sterile fashion. Foley catheter was placed. Cervix was exposed with a speculum and anterior lip of the cervix was grasped with tenaculum. Uterus was sounded to 9 cm. A  # 8 Rumi tip and a medium Koh ring was assembled on the [employer] and entered in the uterine cavity and balloon was inflated to secure it in place. Koh ring was palpated again cervico-vaginal junction. Speculum was removed, tenaculums were left on the cervix.   Attention was focused on abdomen. Supraumbilical 12 mm vertical incision made with  scalpel after injecting Ropivacaine, fascia dissected, grasped with Kocher's and incised, posterior rectus sheath and peritoneum grasped, incised, intraabdominal entry confirmed. Purse string stay stitch on 0-Vicryl taken on fascia and  Robot cannula with occluder.introduced and Vicryl sutures secured on it. pneumoperitoneum created. Laparoscope was introduced and the peritoneal cavity was evaluated. There was no evidence of adhesions. Trendelenburg position given. Port sited marked and injected with Ropivacaine.  Two Robotic cannulas inserted on right side and one on the left side and one Airseal #5 on the left.  Robot was docked from right. Camera inserted and once arms were ready, #1 had vessel sealer, #3 Scissors, #4 Bipolar fenestrated grasper and all energy sources attached.  Dr. Juliene Pina scrubbed out and went for surgical console.  Uterus, ovaries, tubes and ureters evaluated. Filmy adhesions between right ovary and right uterine wall and ovarian fossa incised with scissors. Uterus was enlarged with fibroids. Both ureters course assessed. No bowel adhesions noted.  Uterus was deviated to the patient's right. The left IP ligament was desiccated and cut, followed by broad ligament and then the left Round ligament which was desiccated and cut. Anterior bladder broad ligament was opened and incised. Posterior broad ligament incised up to the uterosacral ligament and left uterine vessels skeletonized. Uterus was deviated to the left and right IP was exposed, desiccated and incised followed by broad ligament and right Round ligament. Anterior broad ligament was incised to create bladder flap, bladder was pushed away by blunt and sharp dissection with excellent hemostasis. Koh ring impression at cervicovaginal junction was seen well anteriorly. Right posterior broad ligament dissected, right Uterine vessels skeletonized. Right uterine vessels were desiccated and cut. Uterus was deviated to the right and the left  uterine vessels were desiccated and cut. Vaginal occluder was inflated. Colpotomy was begun starting from midline  anteriorly coming to the left and right and then circumferentially staying above the uterosacral ligaments posteriorly. Specimen was free. Large uterus was bivalved at the fundus, felt soft. Dr Benjie Karvonen scrubbed to vaginal area and manipulated the specimen out intact. Vaginal occluder placed back in vagina to maintain pneumoperitoneum.  Vaginal cut edges were evaluated for hemostasis which was excellent. Irrigation was performed pedicles appeared dry. Robotic instrument was switched for Meganeedle driver in arm 3 instead of scissors, the rest remained in place. 0-V-Lock was passed from cannula #1 and cuff closure done in running fashion from right to left and back, excellent hemostatic closure without complications.  Ureters assessed again. Normal peristasis noted.  Irrigation was performed, hemostasis noted. Robotic instruments were removed. Robot was undocked. Patient was made supine. Lap'scope was reintroduced, hemostasis was excellent. Remaining Ropivacaine instill in the peritoneum. All cannulas and Airseal removed.  The stay sutures at the fascia tied together with excellent fascial closure. Skin approximated with subcuticular stitches on 4-0 Vicryl. Dermabond was applied. No vaginal bleeding noted on vaginal exam at the end. All counts were correct x2.  No complications.  Patient tolerated procedure well and was reversed from anesthesia and brought to the PACU stable condition. Foley catheter to be removed in PACU. Plan early recovery and discharge.  Dr Benjie Karvonen was the surgeon for entire case. Surgical findings discussed with pt's husband. ---Azucena Fallen, MD

## 2020-04-09 ENCOUNTER — Encounter (HOSPITAL_BASED_OUTPATIENT_CLINIC_OR_DEPARTMENT_OTHER): Payer: Self-pay | Admitting: Obstetrics & Gynecology

## 2020-04-09 NOTE — Anesthesia Postprocedure Evaluation (Signed)
Anesthesia Post Note  Patient: Carolyn Glover  Procedure(s) Performed: XI ROBOTIC ASSISTED TOTAL HYSTERECTOMY WITH BILATERAL SALPINGO OOPHORECTOMY (Bilateral )     Patient location during evaluation: PACU Anesthesia Type: General Level of consciousness: awake and alert Pain management: pain level controlled Vital Signs Assessment: post-procedure vital signs reviewed and stable Respiratory status: spontaneous breathing, nonlabored ventilation and respiratory function stable Cardiovascular status: blood pressure returned to baseline and stable Postop Assessment: no apparent nausea or vomiting Anesthetic complications: no   No complications documented.  Last Vitals:  Vitals:   04/08/20 1830 04/08/20 2132  BP: (!) 143/85 129/74  Pulse: (!) 57 66  Resp: 15 16  Temp: 36.6 C 37.1 C  SpO2: 100% 100%    Last Pain:  Vitals:   04/08/20 2132  TempSrc:   PainSc: 2                  Beryle Lathe

## 2020-04-10 LAB — SURGICAL PATHOLOGY

## 2020-11-15 ENCOUNTER — Other Ambulatory Visit: Payer: Self-pay | Admitting: Family Medicine

## 2020-11-15 DIAGNOSIS — Z1231 Encounter for screening mammogram for malignant neoplasm of breast: Secondary | ICD-10-CM

## 2020-11-27 ENCOUNTER — Telehealth: Payer: Self-pay | Admitting: Genetic Counselor

## 2020-11-27 NOTE — Telephone Encounter (Signed)
Received a genetic counseling referral from Dr. Cherly Hensen for fhx of cancer. [name] has been cld and scheduled for a mychart video vist w/Karen on 2/14 at 9am.

## 2020-12-01 IMAGING — MG MM DIGITAL DIAGNOSTIC UNILAT*R* W/ TOMO W/ CAD
4 series · 4 of 12 positions shown · non-contrast
Comparison: Previous exam(s).

CLINICAL DATA: 58-year-old female with possible RIGHT breast
asymmetry on screening mammogram.

EXAM:
DIGITAL DIAGNOSTIC UNILATERAL RIGHT MAMMOGRAM WITH CAD AND TOMO

[R CC synth-2D]
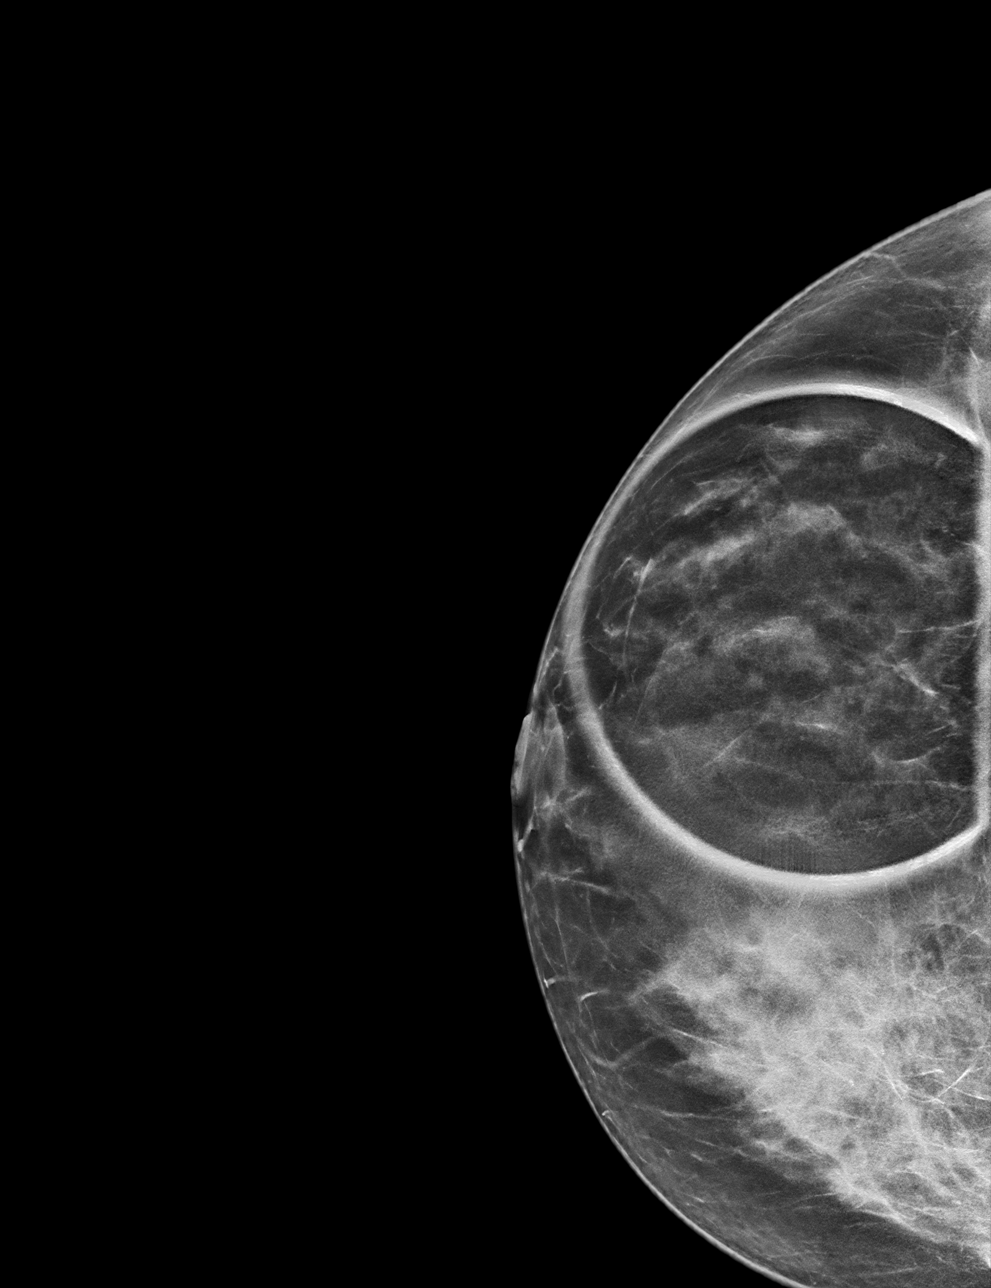

[R MLO synth-2D]
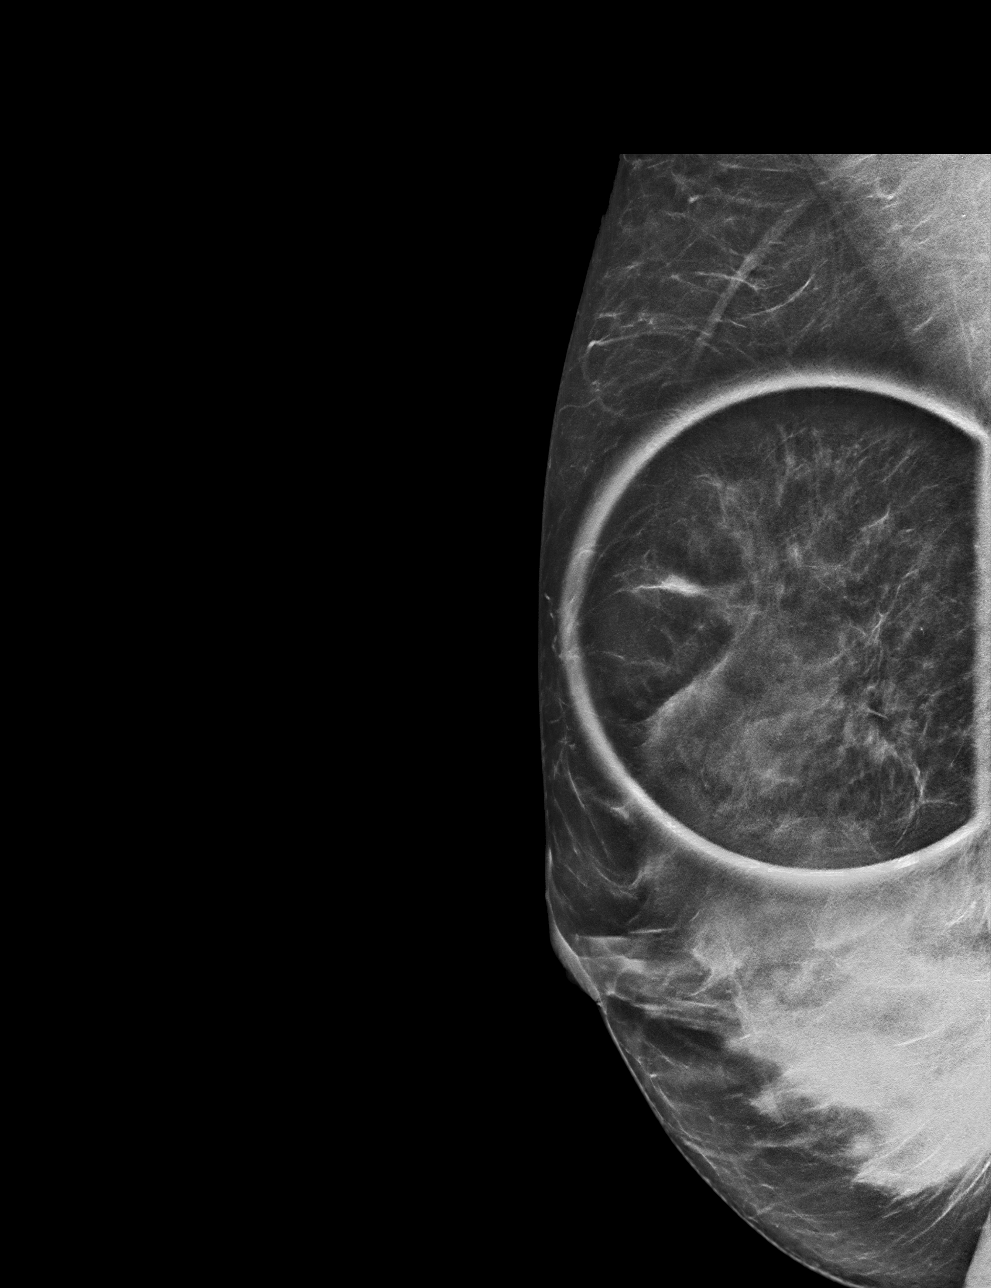

[R CC tomo · tomo slice 33/65.0]
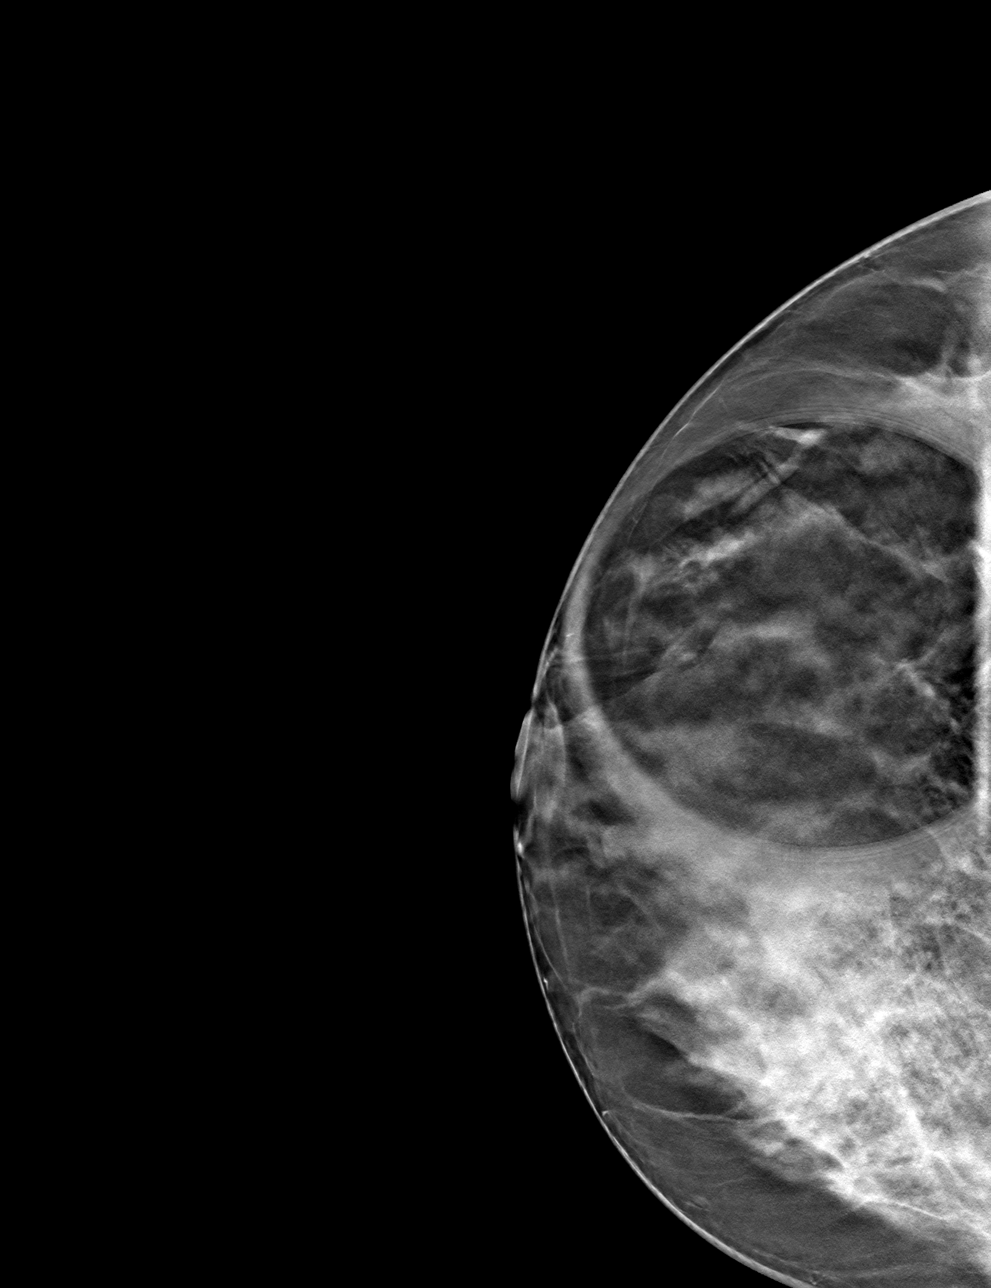

[R MLO tomo · tomo slice 35/68.0]
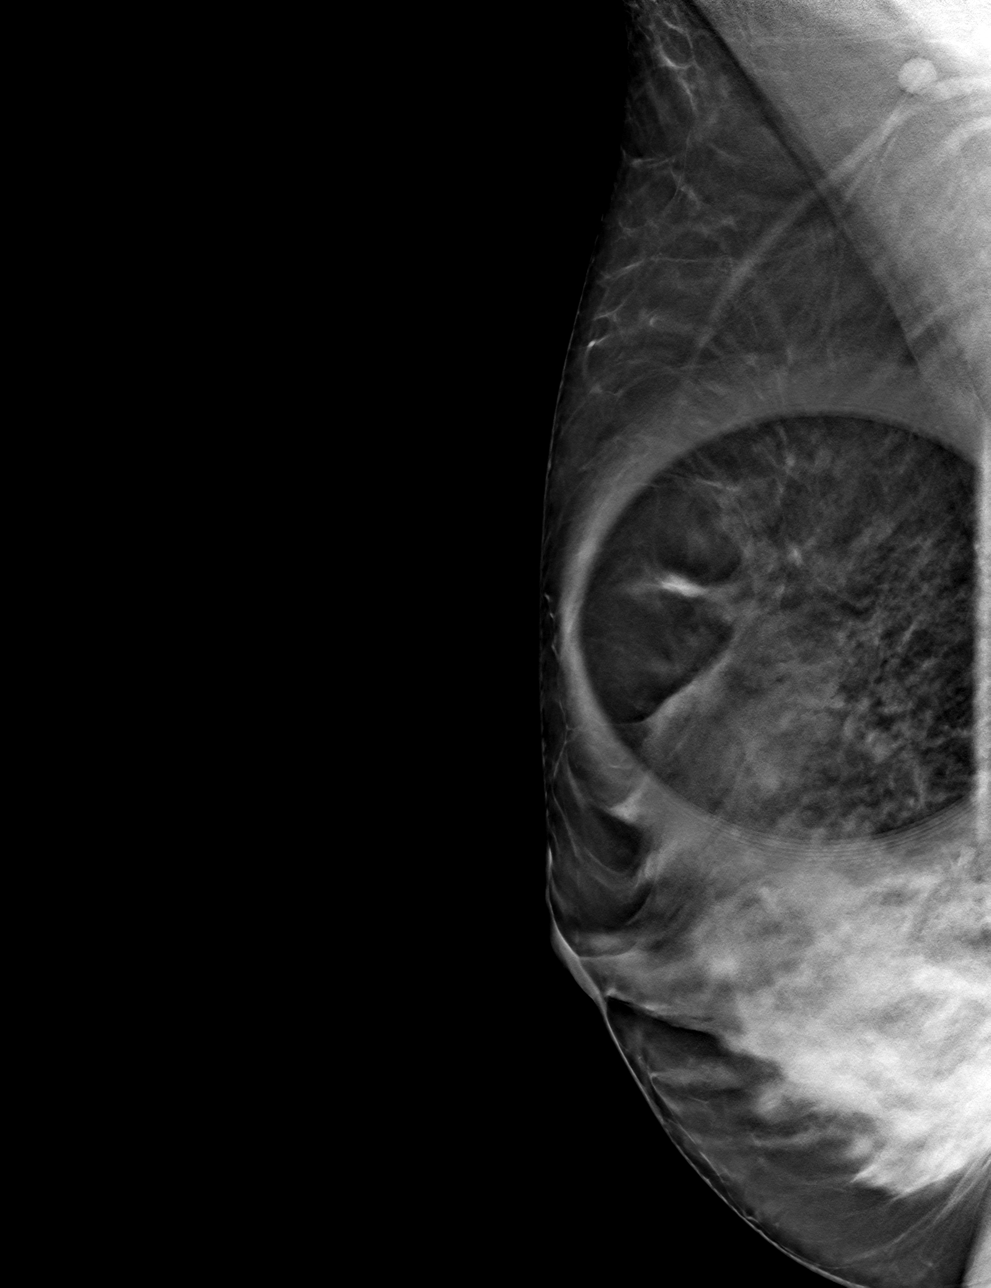

[4 of 12 positions shown; findings below may reference images not displayed]

ACR Breast Density Category c: The breast tissue is heterogeneously
dense, which may obscure small masses.
FINDINGS: 2D/3D spot compression views of the RIGHT breast demonstrate no
persistent abnormality within the UPPER-OUTER RIGHT breast, at the
site of the screening study finding.

Mammographic images were processed with CAD.
IMPRESSION: No persistent mammographic abnormality at the site of the screening
study finding.

RECOMMENDATION:
Bilateral screening mammogram in 1 year.

I have discussed the findings and recommendations with the patient.
If applicable, a reminder letter will be sent to the patient
regarding the next appointment.

BI-RADS CATEGORY  1: Negative.

## 2020-12-02 ENCOUNTER — Encounter: Payer: Self-pay | Admitting: Licensed Clinical Social Worker

## 2020-12-02 ENCOUNTER — Inpatient Hospital Stay: Payer: Managed Care, Other (non HMO) | Attending: Genetic Counselor | Admitting: Licensed Clinical Social Worker

## 2020-12-02 DIAGNOSIS — Z803 Family history of malignant neoplasm of breast: Secondary | ICD-10-CM | POA: Diagnosis not present

## 2020-12-02 DIAGNOSIS — Z1379 Encounter for other screening for genetic and chromosomal anomalies: Secondary | ICD-10-CM | POA: Diagnosis not present

## 2020-12-02 DIAGNOSIS — Z800 Family history of malignant neoplasm of digestive organs: Secondary | ICD-10-CM | POA: Diagnosis not present

## 2020-12-02 NOTE — Progress Notes (Signed)
REFERRING PROVIDER: Cousins, Sheronette, MD [address] [place],  [place] [address]  PRIMARY PROVIDER:  Jackson, Samantha J, PA-C  PRIMARY REASON FOR VISIT:  1. Family history of breast cancer   2. Family history of colon cancer   3. Genetic testing     I connected with Carolyn Glover on 12/02/2020 at 9:00 AM EDT by MyChart video conference and verified that I am speaking with the correct person using two identifiers.    Patient location: home Provider location: [facility]   HISTORY OF PRESENT ILLNESS:   Carolyn Glover, a 60 y.o. female, was seen for a [place] cancer genetics consultation at the request of Dr. Cousins due to a family history of cancer and genetic testing that was done in October 2021.  Carolyn Glover presents to clinic today to discuss the possibility of a hereditary predisposition to cancer, genetic testing, and to further clarify her future cancer risks, as well as potential cancer risks for family members.   Carolyn Glover is a 60 y.o. female with no personal history of cancer.    CANCER HISTORY:  Oncology History   No history exists.    RISK FACTORS:  Menarche was at age 12. .  OCP use for approximately 20 years.  Ovaries intact: no.  Hysterectomy: yes.  Menopausal status: postmenopausal.  HRT use: 3 years. Colonoscopy: yes; 2018. Mammogram within the last year: yes. Number of breast biopsies: 1. Up to date with pelvic exams: yes. Any excessive radiation exposure in the past: no  Past Medical History:  Diagnosis Date  . Anemia    as a young woman  . Dysrhythmia 2009   ablation for PAT  . Family history of breast cancer   . Family history of colon cancer   . Genetic testing   . Headache     Past Surgical History:  Procedure Laterality Date  . BREAST CYST EXCISION Left    No scar seen   . BUNIONECTOMY Bilateral 1980  . COLONOSCOPY N/A 12/07/2016   Procedure: COLONOSCOPY;  Surgeon: Sandi L Fields, MD;  [facility]: [facility];   Service: Endoscopy;  Laterality: N/A;  8:30  . EYE SURGERY  2004, 2010   lasix,   . GANGLION CYST EXCISION Left 1997  . ROBOTIC ASSISTED TOTAL HYSTERECTOMY WITH BILATERAL SALPINGO OOPHERECTOMY Bilateral 04/08/2020   Procedure: XI ROBOTIC ASSISTED TOTAL HYSTERECTOMY WITH BILATERAL SALPINGO OOPHORECTOMY;  Surgeon: Mody, Vaishali, MD;  Location: [facility];  Service: Gynecology;  Laterality: Bilateral;  Tracie RNFA confirmed on 02/19/20 CS  . ROTATOR CUFF REPAIR Left 11/2019  . TONSILLECTOMY     60 years old  . UTERINE FIBROID SURGERY  mid 1990    Social History   Socioeconomic History  . Marital status: Married    Spouse name: Not on file  . Number of children: Not on file  . Years of education: Not on file  . Highest education level: Not on file  Occupational History  . Not on file  Tobacco Use  . Smoking status: Never Smoker  . Smokeless tobacco: Never Used  Vaping Use  . Vaping Use: Never used  Substance and Sexual Activity  . Alcohol use: Yes    Comment: occationally  . Drug use: No  . Sexual activity: Not on file  Other Topics Concern  . Not on file  Social History Narrative  . Not on file   Social Determinants of Health   Financial Resource Strain: Not on file    Food Insecurity: Not on file  Transportation Needs: Not on file  Physical Activity: Not on file  Stress: Not on file  Social Connections: Not on file     FAMILY HISTORY:  We obtained a detailed, 4-generation family history.  Significant diagnoses are listed below: Family History  Problem Relation Age of Onset  . Colon cancer Mother 41  . Breast cancer Maternal Grandmother        diagnosed in her 12's  . Cancer Paternal Aunt    [name] has 3 adopted children. She has 3 sisters and 2 brothers, no cancers.   [name] mother had colon cancer at 35 and died at 86. Patient had 1 maternal uncle, no cancers. Maternal grandmother had breast cancer at 83 and died at 38, grandfather died  at 98. There is Ashkenazi Jewish ancestry on this side of the family.  [name] father is living at 43. She has 3 paternal aunts, 1 paternal uncle. An aunt had cancer, unknown type. No known cancers in paternal grandparents.  [name] is unaware of previous family history of genetic testing for hereditary cancer risks. Patient's maternal ancestors are of [place] descent, and paternal ancestors are of [place] descent. There is no reported Ashkenazi Jewish ancestry. There is no known consanguinity.    GENETIC COUNSELING ASSESSMENT: [name] is a 60 y.o. female with a family history which is somewhat suggestive of a hereditary cancer syndrome and predisposition to cancer. She also had genetic testing through her referring provider. We, therefore, discussed and recommended the following at today's visit.   DISCUSSION: We discussed that approximately 5-10% of cancer is hereditary  Most cases of hereditary breast cancer are associated with BRCA1/BRCA2 genes, although there are other genes associated with hereditary breast cancer as well, and genes related to colon cancer including Lynch syndrome genes.  We discussed that testing is beneficial for several reasons including  knowing about other cancer risks, identifying potential screening and risk-reduction options that may be appropriate, and to understand if other family members could be at risk for cancer and allow them to undergo genetic testing.   GENETIC TEST RESULTS: Genetic testing reported out on 07/23/2020 through the [facility] Multi- cancer panel found no pathogenic mutations.   The Multi-Cancer Panel offered by [facility] includes sequencing and/or deletion duplication testing of the following 84 genes: AIP, ALK, APC, ATM, AXIN2,BAP1,  BARD1, BLM, BMPR1A, BRCA1, BRCA2, BRIP1, CASR, CDC73, CDH1, CDK4, CDKN1B, CDKN1C, CDKN2A (p14ARF), CDKN2A (p16INK4a), CEBPA, CHEK2, CTNNA1, DICER1, DIS3L2, EGFR (c.2369C>T, p.Thr790Met variant  only), EPCAM (Deletion/duplication testing only), FH, FLCN, GATA2, GPC3, GREM1 (Promoter region deletion/duplication testing only), HOXB13 (c.251G>A, p.Gly84Glu), HRAS, KIT, MAX, MEN1, MET, MITF (c.952G>A, p.Glu318Lys variant only), MLH1, MSH2, MSH3, MSH6, MUTYH, NBN, NF1, NF2, NTHL1, PALB2, PDGFRA, PHOX2B, PMS2, POLD1, POLE, POT1, PRKAR1A, PTCH1, PTEN, RAD50, RAD51C, RAD51D, RB1, RECQL4, RET, RNF43, RUNX1, SDHAF2, SDHA (sequence changes only), SDHB, SDHC, SDHD, SMAD4, SMARCA4, SMARCB1, SMARCE1, STK11, SUFU, TERC, TERT, TMEM127, TP53, TSC1, TSC2, VHL, WRN and WT1.   The test report has been scanned into EPIC and is located under the Molecular Pathology section of the Results Review tab.  A portion of the result report is included below for reference.     We discussed with [name] that because current genetic testing is not perfect, it is possible there may be a gene mutation in one of these genes that current testing cannot detect, but that chance is small.  We also discussed, that there could be  another gene that has not yet been discovered, or that we have not yet tested, that is responsible for the cancer diagnoses in the family. It is also possible there is a hereditary cause for the cancer in the family that [name] did not inherit and therefore was not identified in her testing.  Therefore, it is important to remain in touch with cancer genetics in the future so that we can continue to offer [name] the most up to date genetic testing.   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  At this time, it is unknown if this variant is associated with increased cancer risk or if this is a normal finding, but most variants such as this get reclassified to being inconsequential. It should not be used to make medical management decisions. With time, we suspect the lab will determine the significance of this variant, if any. If we do learn more about it, we  will try to contact [name] to discuss it further. However, it is important to stay in touch with [place] periodically and keep the address and phone number up to date.  ADDITIONAL GENETIC TESTING: We discussed with [name] that her genetic testing was fairly extensive.  If there are genes identified to increase cancer risk that can be analyzed in the future, we would be happy to discuss and coordinate this testing at that time.    CANCER SCREENING RECOMMENDATIONS: [name] test result is considered negative (normal).  This means that we have not identified a hereditary cause for her family history of cancer at this time.   While reassuring, this does not definitively rule out a hereditary predisposition to cancer. It is still possible that there could be genetic mutations that are undetectable by current technology. There could be genetic mutations in genes that have not been tested or identified to increase cancer risk.  Therefore, it is recommended she continue to follow the cancer management and screening guidelines provided by her primary healthcare provider.   An individual's cancer risk and medical management are not determined by genetic test results alone. Overall cancer risk assessment incorporates additional factors, including personal medical history, family history, and any available genetic information that may result in a personalized plan for cancer prevention and surveillance.  Based on Carolyn Glover's personal and family history of cancer as well as her genetic test results, risk model Harriett Rush was used to estimate her risk of developing breast cancer. This estimates her lifetime risk of developing breast cancer to be approximately 12%.  The patient's lifetime breast cancer risk is a preliminary estimate based on available information using one of several models endorsed by the [place] (ACS). The ACS recommends consideration of breast MRI screening as an adjunct to  mammography for patients at high risk (defined as 20% or greater lifetime risk).    RECOMMENDATIONS FOR FAMILY MEMBERS:  Relatives in this family might be at some increased risk of developing cancer, over the general population risk, simply due to the family history of cancer.  We recommended female relatives in this family have a yearly mammogram beginning at age 59, or 50 years younger than the earliest onset of cancer, an annual clinical breast exam, and perform monthly breast self-exams. Female relatives in this family should also have a gynecological exam as recommended by their primary provider.  All family members should be referred for colonoscopy starting at age 46.   It is also  possible there is a hereditary cause for the cancer in Carolyn Glover's family that she did not inherit and therefore was not identified in her.  Based on Carolyn Glover's family history, we recommended siblings/maternal relatives have genetic counseling and testing. [name] will let us know if we can be of any assistance in coordinating genetic counseling and/or testing for these family members.  FOLLOW-UP: Lastly, we discussed with [name] that cancer genetics is a rapidly advancing field and it is possible that new genetic tests will be appropriate for her and/or her family members in the future. We encouraged her to remain in contact with cancer genetics on an annual basis so we can update her personal and family histories and let her know of advances in cancer genetics that may benefit this family.   Our contact number was provided. [name] questions were answered to her satisfaction, and she knows she is welcome to call us at anytime with additional questions or concerns.   Faith Rogue, MS, [facility] Genetic Counselor [email or web address] Phone: [phone]  The patient was seen for a total of 35 minutes in face-to-face genetic counseling.  Dr. Grayland Ormond was available for discussion regarding this  case.   _______________________________________________________________________ For Office Staff:  Number of people involved in session: 2 Was an Intern/ student involved with case: yes

## 2020-12-31 ENCOUNTER — Ambulatory Visit: Payer: Managed Care, Other (non HMO)

## 2021-01-03 ENCOUNTER — Other Ambulatory Visit: Payer: Self-pay

## 2021-01-03 ENCOUNTER — Ambulatory Visit
Admission: RE | Admit: 2021-01-03 | Discharge: 2021-01-03 | Disposition: A | Discharge status: Home / Self Care | Payer: Managed Care, Other (non HMO) | Source: Ambulatory Visit | Attending: Family Medicine | Admitting: Family Medicine

## 2021-01-03 DIAGNOSIS — Z1231 Encounter for screening mammogram for malignant neoplasm of breast: Secondary | ICD-10-CM

## 2021-02-18 ENCOUNTER — Other Ambulatory Visit: Payer: Self-pay | Admitting: Endocrinology

## 2021-02-19 ENCOUNTER — Other Ambulatory Visit: Payer: Self-pay | Admitting: Endocrinology

## 2021-02-19 DIAGNOSIS — E230 Hypopituitarism: Secondary | ICD-10-CM

## 2021-03-07 ENCOUNTER — Other Ambulatory Visit: Payer: Self-pay

## 2021-03-07 ENCOUNTER — Ambulatory Visit
Admission: RE | Admit: 2021-03-07 | Discharge: 2021-03-07 | Disposition: A | Discharge status: Home / Self Care | Payer: Managed Care, Other (non HMO) | Source: Ambulatory Visit | Attending: Endocrinology | Admitting: Endocrinology

## 2021-03-07 DIAGNOSIS — E230 Hypopituitarism: Secondary | ICD-10-CM

## 2021-03-07 MED ORDER — GADOBENATE DIMEGLUMINE 529 MG/ML IV SOLN
7.0000 mL | Freq: Once | INTRAVENOUS | Status: AC | PRN
  Administered 2021-03-07: 7 mL via INTRAVENOUS

## 2021-12-10 ENCOUNTER — Other Ambulatory Visit: Payer: Self-pay | Admitting: Family Medicine

## 2021-12-10 DIAGNOSIS — Z1231 Encounter for screening mammogram for malignant neoplasm of breast: Secondary | ICD-10-CM

## 2022-01-05 ENCOUNTER — Ambulatory Visit: Payer: Managed Care, Other (non HMO)

## 2022-01-12 ENCOUNTER — Ambulatory Visit
Admission: RE | Admit: 2022-01-12 | Discharge: 2022-01-12 | Disposition: A | Discharge status: Home / Self Care | Payer: Managed Care, Other (non HMO) | Source: Ambulatory Visit | Attending: Family Medicine | Admitting: Family Medicine

## 2022-01-12 DIAGNOSIS — Z1231 Encounter for screening mammogram for malignant neoplasm of breast: Secondary | ICD-10-CM

## 2022-01-13 ENCOUNTER — Other Ambulatory Visit: Payer: Self-pay | Admitting: Family Medicine

## 2022-01-13 DIAGNOSIS — R928 Other abnormal and inconclusive findings on diagnostic imaging of breast: Secondary | ICD-10-CM

## 2022-02-16 ENCOUNTER — Other Ambulatory Visit: Payer: Self-pay | Admitting: Family Medicine

## 2022-02-16 ENCOUNTER — Ambulatory Visit
Admission: RE | Admit: 2022-02-16 | Discharge: 2022-02-16 | Disposition: A | Discharge status: Home / Self Care | Payer: Managed Care, Other (non HMO) | Source: Ambulatory Visit | Attending: Family Medicine | Admitting: Family Medicine

## 2022-02-16 DIAGNOSIS — R928 Other abnormal and inconclusive findings on diagnostic imaging of breast: Secondary | ICD-10-CM

## 2022-02-16 DIAGNOSIS — R921 Mammographic calcification found on diagnostic imaging of breast: Secondary | ICD-10-CM

## 2022-02-23 ENCOUNTER — Ambulatory Visit
Admission: RE | Admit: 2022-02-23 | Discharge: 2022-02-23 | Disposition: A | Discharge status: Home / Self Care | Payer: Managed Care, Other (non HMO) | Source: Ambulatory Visit | Attending: Family Medicine | Admitting: Family Medicine

## 2022-02-23 DIAGNOSIS — R921 Mammographic calcification found on diagnostic imaging of breast: Secondary | ICD-10-CM

## 2023-01-14 ENCOUNTER — Other Ambulatory Visit: Payer: Self-pay | Admitting: Family Medicine

## 2023-01-14 DIAGNOSIS — Z1231 Encounter for screening mammogram for malignant neoplasm of breast: Secondary | ICD-10-CM

## 2023-03-08 ENCOUNTER — Ambulatory Visit
Admission: RE | Admit: 2023-03-08 | Discharge: 2023-03-08 | Disposition: A | Discharge status: Home / Self Care | Payer: BC Managed Care – PPO | Source: Ambulatory Visit | Attending: Family Medicine | Admitting: Family Medicine

## 2023-03-08 DIAGNOSIS — Z1231 Encounter for screening mammogram for malignant neoplasm of breast: Secondary | ICD-10-CM

## 2024-02-08 ENCOUNTER — Other Ambulatory Visit: Payer: Self-pay | Admitting: Family Medicine

## 2024-02-08 DIAGNOSIS — Z1231 Encounter for screening mammogram for malignant neoplasm of breast: Secondary | ICD-10-CM

## 2024-03-08 ENCOUNTER — Ambulatory Visit
Admission: RE | Admit: 2024-03-08 | Discharge: 2024-03-08 | Disposition: A | Discharge status: Home / Self Care | Payer: Self-pay | Source: Ambulatory Visit | Attending: Family Medicine | Admitting: Family Medicine

## 2024-03-08 DIAGNOSIS — Z1231 Encounter for screening mammogram for malignant neoplasm of breast: Secondary | ICD-10-CM

## 2024-03-14 ENCOUNTER — Other Ambulatory Visit: Payer: Self-pay | Admitting: Family Medicine

## 2024-03-14 DIAGNOSIS — R928 Other abnormal and inconclusive findings on diagnostic imaging of breast: Secondary | ICD-10-CM

## 2024-03-15 ENCOUNTER — Other Ambulatory Visit: Payer: Self-pay | Admitting: Medical Genetics

## 2024-03-16 ENCOUNTER — Other Ambulatory Visit

## 2024-03-16 DIAGNOSIS — Z006 Encounter for examination for normal comparison and control in clinical research program: Secondary | ICD-10-CM

## 2024-03-23 ENCOUNTER — Ambulatory Visit
Admission: RE | Admit: 2024-03-23 | Discharge: 2024-03-23 | Disposition: A | Discharge status: Home / Self Care | Source: Ambulatory Visit | Attending: Family Medicine | Admitting: Family Medicine

## 2024-03-23 ENCOUNTER — Ambulatory Visit

## 2024-03-23 DIAGNOSIS — R928 Other abnormal and inconclusive findings on diagnostic imaging of breast: Secondary | ICD-10-CM

## 2024-03-24 LAB — GENECONNECT MOLECULAR SCREEN: Genetic Analysis Overall Interpretation: NEGATIVE

## 2024-09-21 ENCOUNTER — Ambulatory Visit
Admission: RE | Admit: 2024-09-21 | Discharge: 2024-09-21 | Disposition: A | Source: Ambulatory Visit | Attending: Family Medicine | Admitting: Family Medicine

## 2024-09-21 ENCOUNTER — Other Ambulatory Visit: Payer: Self-pay | Admitting: Family Medicine

## 2024-09-21 DIAGNOSIS — M1990 Unspecified osteoarthritis, unspecified site: Secondary | ICD-10-CM
